# Patient Record
Sex: Female | Born: 1950 | Race: Black or African American | Hispanic: No | Smoking: Former smoker
Health system: Southern US, Community
[De-identification: ages and names within clinical notes are randomized; demographics above are authoritative.]

## PROBLEM LIST (undated history)

## (undated) DIAGNOSIS — K219 Gastro-esophageal reflux disease without esophagitis: Secondary | ICD-10-CM

## (undated) DIAGNOSIS — I219 Acute myocardial infarction, unspecified: Secondary | ICD-10-CM

## (undated) DIAGNOSIS — M1990 Unspecified osteoarthritis, unspecified site: Secondary | ICD-10-CM

## (undated) DIAGNOSIS — F32A Depression, unspecified: Secondary | ICD-10-CM

## (undated) DIAGNOSIS — F329 Major depressive disorder, single episode, unspecified: Secondary | ICD-10-CM

## (undated) HISTORY — PX: ABDOMINAL HYSTERECTOMY: SHX81

---

## 2011-07-27 ENCOUNTER — Emergency Department: Payer: Self-pay | Admitting: Emergency Medicine

## 2017-12-17 ENCOUNTER — Ambulatory Visit: Payer: Self-pay | Admitting: Surgery

## 2017-12-17 NOTE — H&P (View-Only) (Signed)
Subjective:   CC: Sebaceous cyst of left axilla [L72.3] POSTOP  HPI:  Megan Rosales is a 67 y.o. female who is here for followup from above.  Still feels some swelling and has drainage but significantly better.     Current Medications: has a current medication list which includes the following prescription(s): doxycycline and naproxen.  Allergies:       Allergies  Allergen Reactions  . Penicillin Other (See Comments)    Yeast infections    ROS: A 15 point review of systems was performed and pertinent positives and negatives noted in HPI   Objective:   BP (!) 151/90   Pulse 64   Temp 36.6 C (97.8 F) (Oral)   Ht 154.9 cm (5\' 1" )   Wt 77.6 kg (171 lb 1.2 oz)   BMI 32.32 kg/m   Constitutional :  alert, appears stated age, cooperative and no distress  Skin: Cool and moist, I&D site with minimal milky discharge and no erythema, but persistant firm, smooth nodular structure noted deep to are with minimal tenderness    Psychiatric: Normal affect, non-agitated, not confused       LABS:  N/A   RADS: N/A  Assessment:      Sebaceous cyst of left axilla [L72.3]  Plan:   1. Wound still has pin hole opening through which scant creamy discharge noted, but more concerning is persistent nodule like mass deep the incision site despite removal of cyst and contents few days ago.  Residual inflammation of surrounding tissue two days out less likely at this point, possible adjacent cyst that needs removal.  Due to patient anxiety and palpation of it possibly being in the deeper axilla, recommended further exploration, possible excision in the OR.  Discussed surgical excision.  Alternatives include continued observation.  Benefits include possible symptom relief, pathologic evaluation, improved cosmesis. Discussed the risk of surgery including recurrence, chronic pain, post-op infxn, poor cosmesis, poor/delayed wound healing, and possible re-operation to address said  risks. The risks of general anesthetic, if used, includes MI, CVA, sudden death or even reaction to anesthetic medications also discussed.  Typical post-op recovery time of 3-5 days with possible activity restrictions were also discussed.  The patient verbalized understanding and all questions were answered to the patient's satisfaction.  Will schedule.  Consent obtained

## 2017-12-17 NOTE — H&P (Signed)
Subjective:   CC: Sebaceous cyst of left axilla [L72.3] POSTOP  HPI:  Megan Rosales is a 67 y.o. female who is here for followup from above.  Still feels some swelling and has drainage but significantly better.     Current Medications: has a current medication list which includes the following prescription(s): doxycycline and naproxen.  Allergies:       Allergies  Allergen Reactions  . Penicillin Other (See Comments)    Yeast infections    ROS: A 15 point review of systems was performed and pertinent positives and negatives noted in HPI   Objective:   BP (!) 151/90   Pulse 64   Temp 36.6 C (97.8 F) (Oral)   Ht 154.9 cm (5' 1")   Wt 77.6 kg (171 lb 1.2 oz)   BMI 32.32 kg/m   Constitutional :  alert, appears stated age, cooperative and no distress  Skin: Cool and moist, I&D site with minimal milky discharge and no erythema, but persistant firm, smooth nodular structure noted deep to are with minimal tenderness    Psychiatric: Normal affect, non-agitated, not confused       LABS:  N/A   RADS: N/A  Assessment:      Sebaceous cyst of left axilla [L72.3]  Plan:   1. Wound still has pin hole opening through which scant creamy discharge noted, but more concerning is persistent nodule like mass deep the incision site despite removal of cyst and contents few days ago.  Residual inflammation of surrounding tissue two days out less likely at this point, possible adjacent cyst that needs removal.  Due to patient anxiety and palpation of it possibly being in the deeper axilla, recommended further exploration, possible excision in the OR.  Discussed surgical excision.  Alternatives include continued observation.  Benefits include possible symptom relief, pathologic evaluation, improved cosmesis. Discussed the risk of surgery including recurrence, chronic pain, post-op infxn, poor cosmesis, poor/delayed wound healing, and possible re-operation to address said  risks. The risks of general anesthetic, if used, includes MI, CVA, sudden death or even reaction to anesthetic medications also discussed.  Typical post-op recovery time of 3-5 days with possible activity restrictions were also discussed.  The patient verbalized understanding and all questions were answered to the patient's satisfaction.  Will schedule.  Consent obtained    

## 2017-12-19 ENCOUNTER — Ambulatory Visit
Admission: RE | Admit: 2017-12-19 | Discharge: 2017-12-19 | Disposition: A | Discharge status: Home / Self Care | Payer: No Typology Code available for payment source | Attending: Surgery | Admitting: Surgery

## 2017-12-19 ENCOUNTER — Encounter
Admission: RE | Disposition: A | Discharge status: Home / Self Care | Payer: Self-pay | Source: Home / Self Care | Attending: Surgery

## 2017-12-19 ENCOUNTER — Ambulatory Visit: Payer: No Typology Code available for payment source | Admitting: Anesthesiology

## 2017-12-19 ENCOUNTER — Other Ambulatory Visit: Payer: Self-pay

## 2017-12-19 ENCOUNTER — Encounter: Payer: Self-pay | Admitting: *Deleted

## 2017-12-19 DIAGNOSIS — Z791 Long term (current) use of non-steroidal anti-inflammatories (NSAID): Secondary | ICD-10-CM | POA: Insufficient documentation

## 2017-12-19 DIAGNOSIS — Z87891 Personal history of nicotine dependence: Secondary | ICD-10-CM | POA: Diagnosis not present

## 2017-12-19 DIAGNOSIS — L720 Epidermal cyst: Secondary | ICD-10-CM | POA: Insufficient documentation

## 2017-12-19 DIAGNOSIS — Z880 Allergy status to penicillin: Secondary | ICD-10-CM | POA: Insufficient documentation

## 2017-12-19 HISTORY — DX: Major depressive disorder, single episode, unspecified: F32.9

## 2017-12-19 HISTORY — PX: MASS EXCISION: SHX2000

## 2017-12-19 HISTORY — DX: Depression, unspecified: F32.A

## 2017-12-19 LAB — URINE DRUG SCREEN, QUALITATIVE (ARMC ONLY)
Amphetamines, Ur Screen: NOT DETECTED
Barbiturates, Ur Screen: NOT DETECTED
Benzodiazepine, Ur Scrn: NOT DETECTED
Cannabinoid 50 Ng, Ur ~~LOC~~: POSITIVE — AB
Cocaine Metabolite,Ur ~~LOC~~: NOT DETECTED
MDMA (Ecstasy)Ur Screen: NOT DETECTED
Methadone Scn, Ur: NOT DETECTED
Opiate, Ur Screen: NOT DETECTED
Phencyclidine (PCP) Ur S: NOT DETECTED
TRICYCLIC, UR SCREEN: NOT DETECTED

## 2017-12-19 SURGERY — EXCISION MASS
Anesthesia: General | Site: Axilla | Laterality: Left

## 2017-12-19 MED ORDER — FENTANYL CITRATE (PF) 100 MCG/2ML IJ SOLN
INTRAMUSCULAR | Status: AC
  Filled 2017-12-19: qty 2

## 2017-12-19 MED ORDER — GLYCOPYRROLATE 0.2 MG/ML IJ SOLN
INTRAMUSCULAR | Status: DC | PRN
  Administered 2017-12-19: 0.2 mg via INTRAVENOUS

## 2017-12-19 MED ORDER — LACTATED RINGERS IV SOLN
INTRAVENOUS | Status: DC
  Administered 2017-12-19: 08:00:00 via INTRAVENOUS

## 2017-12-19 MED ORDER — ONDANSETRON HCL 4 MG/2ML IJ SOLN: 4.0000 mg | Freq: Once | INTRAMUSCULAR | Status: DC | PRN

## 2017-12-19 MED ORDER — ACETAMINOPHEN 500 MG PO TABS
1000.0000 mg | ORAL_TABLET | ORAL | Status: AC
  Administered 2017-12-19: 1000 mg via ORAL

## 2017-12-19 MED ORDER — PROPOFOL 10 MG/ML IV BOLUS
INTRAVENOUS | Status: AC
  Filled 2017-12-19: qty 60

## 2017-12-19 MED ORDER — DOCUSATE SODIUM 100 MG PO CAPS: 100.0000 mg | ORAL_CAPSULE | Freq: Two times a day (BID) | ORAL | 0 refills | Status: AC | PRN

## 2017-12-19 MED ORDER — ONDANSETRON HCL 4 MG/2ML IJ SOLN
INTRAMUSCULAR | Status: DC | PRN
  Administered 2017-12-19: 4 mg via INTRAVENOUS

## 2017-12-19 MED ORDER — CEFAZOLIN SODIUM-DEXTROSE 2-4 GM/100ML-% IV SOLN
2.0000 g | INTRAVENOUS | Status: AC
  Administered 2017-12-19: 2 g via INTRAVENOUS

## 2017-12-19 MED ORDER — FENTANYL CITRATE (PF) 100 MCG/2ML IJ SOLN
25.0000 ug | INTRAMUSCULAR | Status: DC | PRN
  Administered 2017-12-19: 25 ug via INTRAVENOUS

## 2017-12-19 MED ORDER — EPHEDRINE SULFATE 50 MG/ML IJ SOLN
INTRAMUSCULAR | Status: DC | PRN
  Administered 2017-12-19: 7.5 mg via INTRAVENOUS

## 2017-12-19 MED ORDER — DEXAMETHASONE SODIUM PHOSPHATE 10 MG/ML IJ SOLN
INTRAMUSCULAR | Status: DC | PRN
  Administered 2017-12-19: 5 mg via INTRAVENOUS

## 2017-12-19 MED ORDER — BUPIVACAINE-EPINEPHRINE (PF) 0.5% -1:200000 IJ SOLN
INTRAMUSCULAR | Status: AC
  Filled 2017-12-19: qty 30

## 2017-12-19 MED ORDER — PROPOFOL 10 MG/ML IV BOLUS
INTRAVENOUS | Status: DC | PRN
  Administered 2017-12-19: 150 mg via INTRAVENOUS
  Administered 2017-12-19: 50 mg via INTRAVENOUS

## 2017-12-19 MED ORDER — BUPIVACAINE-EPINEPHRINE 0.5% -1:200000 IJ SOLN
INTRAMUSCULAR | Status: DC | PRN
  Administered 2017-12-19: 10 mL

## 2017-12-19 MED ORDER — CEFAZOLIN SODIUM-DEXTROSE 2-4 GM/100ML-% IV SOLN
INTRAVENOUS | Status: AC
  Filled 2017-12-19: qty 100

## 2017-12-19 MED ORDER — FENTANYL CITRATE (PF) 100 MCG/2ML IJ SOLN
INTRAMUSCULAR | Status: DC | PRN
  Administered 2017-12-19: 50 ug via INTRAVENOUS

## 2017-12-19 MED ORDER — CHLORHEXIDINE GLUCONATE CLOTH 2 % EX PADS: 6.0000 | MEDICATED_PAD | Freq: Once | CUTANEOUS | Status: DC

## 2017-12-19 MED ORDER — FAMOTIDINE 20 MG PO TABS
ORAL_TABLET | ORAL | Status: AC
  Filled 2017-12-19: qty 1

## 2017-12-19 MED ORDER — ACETAMINOPHEN 325 MG PO TABS: 650.0000 mg | ORAL_TABLET | Freq: Three times a day (TID) | ORAL | 0 refills | Status: AC | PRN

## 2017-12-19 MED ORDER — LIDOCAINE HCL 1 % IJ SOLN
INTRAMUSCULAR | Status: DC | PRN
  Administered 2017-12-19: 10 mL

## 2017-12-19 MED ORDER — FAMOTIDINE 20 MG PO TABS
20.0000 mg | ORAL_TABLET | Freq: Once | ORAL | Status: AC
  Administered 2017-12-19: 20 mg via ORAL

## 2017-12-19 MED ORDER — LIDOCAINE HCL (PF) 1 % IJ SOLN
INTRAMUSCULAR | Status: AC
  Filled 2017-12-19: qty 30

## 2017-12-19 MED ORDER — HYDROCODONE-ACETAMINOPHEN 5-325 MG PO TABS: 1.0000 | ORAL_TABLET | Freq: Four times a day (QID) | ORAL | 0 refills | Status: DC | PRN

## 2017-12-19 MED ORDER — LIDOCAINE HCL (CARDIAC) PF 100 MG/5ML IV SOSY
PREFILLED_SYRINGE | INTRAVENOUS | Status: DC | PRN
  Administered 2017-12-19: 100 mg via INTRAVENOUS

## 2017-12-19 MED ORDER — ACETAMINOPHEN 500 MG PO TABS
ORAL_TABLET | ORAL | Status: AC
  Filled 2017-12-19: qty 2

## 2017-12-19 SURGICAL SUPPLY — 30 items
BLADE SURG 15 STRL LF DISP TIS (BLADE) ×1 IMPLANT
BLADE SURG 15 STRL SS (BLADE) ×2
CHLORAPREP W/TINT 26ML (MISCELLANEOUS) ×3 IMPLANT
COVER WAND RF STERILE (DRAPES) ×3 IMPLANT
DERMABOND ADVANCED (GAUZE/BANDAGES/DRESSINGS) ×2
DERMABOND ADVANCED .7 DNX12 (GAUZE/BANDAGES/DRESSINGS) ×1 IMPLANT
DRAPE LAPAROTOMY 100X77 ABD (DRAPES) ×3 IMPLANT
DRAPE SHEET LG 3/4 BI-LAMINATE (DRAPES) ×3 IMPLANT
ELECT CAUTERY BLADE 6.4 (BLADE) ×3 IMPLANT
ELECT REM PT RETURN 9FT ADLT (ELECTROSURGICAL) ×3
ELECTRODE REM PT RTRN 9FT ADLT (ELECTROSURGICAL) ×1 IMPLANT
GLOVE BIOGEL PI IND STRL 7.0 (GLOVE) ×1 IMPLANT
GLOVE BIOGEL PI INDICATOR 7.0 (GLOVE) ×2
GLOVE SURG SYN 6.5 ES PF (GLOVE) ×3 IMPLANT
GOWN STRL REUS W/ TWL LRG LVL3 (GOWN DISPOSABLE) ×2 IMPLANT
GOWN STRL REUS W/TWL LRG LVL3 (GOWN DISPOSABLE) ×4
KIT TURNOVER KIT A (KITS) ×3 IMPLANT
LABEL OR SOLS (LABEL) ×3 IMPLANT
NEEDLE HYPO 22GX1.5 SAFETY (NEEDLE) ×3 IMPLANT
NS IRRIG 1000ML POUR BTL (IV SOLUTION) ×3 IMPLANT
PACK BASIN MINOR ARMC (MISCELLANEOUS) ×3 IMPLANT
SUT ETHILON 3-0 FS-10 30 BLK (SUTURE)
SUT MNCRL 4-0 (SUTURE) ×2
SUT MNCRL 4-0 27XMFL (SUTURE) ×1
SUT VIC AB 3-0 SH 27 (SUTURE) ×2
SUT VIC AB 3-0 SH 27X BRD (SUTURE) ×1 IMPLANT
SUTURE EHLN 3-0 FS-10 30 BLK (SUTURE) IMPLANT
SUTURE MNCRL 4-0 27XMF (SUTURE) ×1 IMPLANT
SYR 30ML LL (SYRINGE) ×3 IMPLANT
TOWEL OR 17X26 4PK STRL BLUE (TOWEL DISPOSABLE) ×3 IMPLANT

## 2017-12-19 NOTE — Anesthesia Preprocedure Evaluation (Signed)
Anesthesia Evaluation  Patient identified by MRN, date of birth, ID band Patient awake    Reviewed: Allergy & Precautions, NPO status , Patient's Chart, lab work & pertinent test results  Airway Mallampati: III       Dental   Pulmonary former smoker,    Pulmonary exam normal        Cardiovascular negative cardio ROS Normal cardiovascular exam     Neuro/Psych PSYCHIATRIC DISORDERS Depression negative neurological ROS     GI/Hepatic negative GI ROS, Neg liver ROS,   Endo/Other  negative endocrine ROS  Renal/GU negative Renal ROS  negative genitourinary   Musculoskeletal negative musculoskeletal ROS (+)   Abdominal Normal abdominal exam  (+)   Peds negative pediatric ROS (+)  Hematology negative hematology ROS (+)   Anesthesia Other Findings   Reproductive/Obstetrics                             Anesthesia Physical Anesthesia Plan  ASA: II  Anesthesia Plan: General   Post-op Pain Management:    Induction: Intravenous  PONV Risk Score and Plan:   Airway Management Planned: LMA and Oral ETT  Additional Equipment:   Intra-op Plan:   Post-operative Plan: Extubation in OR  Informed Consent: I have reviewed the patients History and Physical, chart, labs and discussed the procedure including the risks, benefits and alternatives for the proposed anesthesia with the patient or authorized representative who has indicated his/her understanding and acceptance.   Dental advisory given  Plan Discussed with: CRNA and Surgeon  Anesthesia Plan Comments:         Anesthesia Quick Evaluation

## 2017-12-19 NOTE — Interval H&P Note (Signed)
History and Physical Interval Note:  12/19/2017 7:15 AM  Megan Rosales  has presented today for surgery, with the diagnosis of AXILLARY CYST LEFT  The various methods of treatment have been discussed with the patient and family. After consideration of risks, benefits and other options for treatment, the patient has consented to  Procedure(s): EXCISION LEFT AXILLARY CYST (Left) as a surgical intervention .  The patient's history has been reviewed, patient examined, no change in status, stable for surgery.  I have reviewed the patient's chart and labs.  Questions were answered to the patient's satisfaction.    Pt endorsed marjiuana use this am.  Will send UDS and once clear, will still proceed with surgery   Hakiem Malizia Tonna BoehringerSakai

## 2017-12-19 NOTE — Anesthesia Postprocedure Evaluation (Signed)
Anesthesia Post Note  Patient: Megan Rosales  Procedure(s) Performed: EXCISION LEFT AXILLARY CYST (Left Axilla)  Patient location during evaluation: PACU Anesthesia Type: General Level of consciousness: awake and alert and oriented Pain management: pain level controlled Vital Signs Assessment: post-procedure vital signs reviewed and stable Respiratory status: spontaneous breathing Cardiovascular status: blood pressure returned to baseline Anesthetic complications: no     Last Vitals:  Vitals:   12/19/17 0929 12/19/17 0955  BP: 109/66 127/74  Pulse: 76 70  Resp: 18 18  Temp: 36.5 C 36.6 C  SpO2: 96% 100%    Last Pain:  Vitals:   12/19/17 0955  TempSrc: Oral  PainSc: 2                  Arrin Pintor

## 2017-12-19 NOTE — Anesthesia Procedure Notes (Signed)
Procedure Name: LMA Insertion Date/Time: 12/19/2017 8:00 AM Performed by: Henrietta HooverSmith, Dru Laurel, CRNA Pre-anesthesia Checklist: Patient identified, Emergency Drugs available, Suction available and Patient being monitored Patient Re-evaluated:Patient Re-evaluated prior to induction Preoxygenation: Pre-oxygenation with 100% oxygen Induction Type: IV induction Ventilation: Mask ventilation without difficulty LMA: LMA inserted LMA Size: 4.0 Number of attempts: 1 Placement Confirmation: positive ETCO2 and breath sounds checked- equal and bilateral Tube secured with: Tape Dental Injury: Teeth and Oropharynx as per pre-operative assessment

## 2017-12-19 NOTE — Op Note (Signed)
Pre-Op Dx: left epidermal cyst Post-Op Dx: same Anesthesia: LMA EBL: minimal Complications:  none apparent Specimen: left axillary cyst Procedure: excisional biopsy of left axillary cyst\  Surgeon: Tonna BoehringerSakai   Description of Procedure:  Consent obtained, time out performed.  Patient placed in supine position.  Ancef and SCDs placed.  LMA by anesthesia.  Area sterilized and draped in usual position.  Local infused to area previously marked.  4cm elliptical incision made through dermis with 15blade and the epidermal cyst noted in subcutaneous layer.  The 3cm x 4cm x 3cm cyst then removed from surrounding tissue completely using electrocautery down to subcutaneous tissue, passed off field pending pathology.  Wound irrigated hemostasis noted, then closed in two layer fashion with 3-0 vicryl in interrupted fashion for deep dermal layer, then running 4-0 monocryl in subcuticular fashion for epidermal layer.  Wound then dressed with dermabond.  Pt tolerated procedure well, and transferred to PACU in stable condition. Sponge and instrument count correct at end of procedure.

## 2017-12-19 NOTE — Anesthesia Post-op Follow-up Note (Signed)
Anesthesia QCDR form completed.        

## 2017-12-19 NOTE — Discharge Instructions (Signed)

## 2017-12-19 NOTE — Transfer of Care (Signed)
Immediate Anesthesia Transfer of Care Note  Patient: Megan Rosales  Procedure(s) Performed: EXCISION LEFT AXILLARY CYST (Left Axilla)  Patient Location: PACU  Anesthesia Type:General  Level of Consciousness: awake  Airway & Oxygen Therapy: Patient connected to face mask oxygen  Post-op Assessment: Post -op Vital signs reviewed and stable  Post vital signs: stable  Last Vitals:  Vitals Value Taken Time  BP 128/77 12/19/2017  8:39 AM  Temp    Pulse 110 12/19/2017  8:39 AM  Resp 21 12/19/2017  8:39 AM  SpO2 97 % 12/19/2017  8:39 AM  Vitals shown include unvalidated device data.  Last Pain:  Vitals:   12/19/17 0619  TempSrc: Tympanic  PainSc: 0-No pain         Complications: No apparent anesthesia complications

## 2017-12-20 ENCOUNTER — Encounter: Payer: Self-pay | Admitting: Surgery

## 2017-12-20 LAB — SURGICAL PATHOLOGY

## 2018-06-19 ENCOUNTER — Encounter: Payer: Self-pay | Admitting: Emergency Medicine

## 2018-06-19 ENCOUNTER — Inpatient Hospital Stay
Admission: EM | Admit: 2018-06-19 | Discharge: 2018-06-21 | DRG: 282 | Disposition: A | Discharge status: Home / Self Care | Payer: PRIVATE HEALTH INSURANCE | Attending: Internal Medicine | Admitting: Internal Medicine

## 2018-06-19 ENCOUNTER — Other Ambulatory Visit: Payer: Self-pay

## 2018-06-19 ENCOUNTER — Emergency Department: Payer: PRIVATE HEALTH INSURANCE

## 2018-06-19 DIAGNOSIS — I214 Non-ST elevation (NSTEMI) myocardial infarction: Secondary | ICD-10-CM | POA: Diagnosis present

## 2018-06-19 DIAGNOSIS — Z1159 Encounter for screening for other viral diseases: Secondary | ICD-10-CM | POA: Diagnosis not present

## 2018-06-19 DIAGNOSIS — I10 Essential (primary) hypertension: Secondary | ICD-10-CM | POA: Diagnosis present

## 2018-06-19 DIAGNOSIS — F329 Major depressive disorder, single episode, unspecified: Secondary | ICD-10-CM | POA: Diagnosis present

## 2018-06-19 DIAGNOSIS — Z87891 Personal history of nicotine dependence: Secondary | ICD-10-CM | POA: Diagnosis not present

## 2018-06-19 DIAGNOSIS — R079 Chest pain, unspecified: Secondary | ICD-10-CM

## 2018-06-19 DIAGNOSIS — Z880 Allergy status to penicillin: Secondary | ICD-10-CM

## 2018-06-19 LAB — CBC WITH DIFFERENTIAL/PLATELET
Abs Immature Granulocytes: 0.03 10*3/uL (ref 0.00–0.07)
Basophils Absolute: 0 10*3/uL (ref 0.0–0.1)
Basophils Relative: 0 %
Eosinophils Absolute: 0.2 10*3/uL (ref 0.0–0.5)
Eosinophils Relative: 2 %
HCT: 39.2 % (ref 36.0–46.0)
Hemoglobin: 13.1 g/dL (ref 12.0–15.0)
Immature Granulocytes: 0 %
Lymphocytes Relative: 26 %
Lymphs Abs: 2.7 10*3/uL (ref 0.7–4.0)
MCH: 30 pg (ref 26.0–34.0)
MCHC: 33.4 g/dL (ref 30.0–36.0)
MCV: 89.7 fL (ref 80.0–100.0)
Monocytes Absolute: 0.8 10*3/uL (ref 0.1–1.0)
Monocytes Relative: 7 %
Neutro Abs: 6.7 10*3/uL (ref 1.7–7.7)
Neutrophils Relative %: 65 %
Platelets: 218 10*3/uL (ref 150–400)
RBC: 4.37 MIL/uL (ref 3.87–5.11)
RDW: 12.6 % (ref 11.5–15.5)
WBC: 10.5 10*3/uL (ref 4.0–10.5)
nRBC: 0 % (ref 0.0–0.2)

## 2018-06-19 LAB — PROTIME-INR
INR: 1.1 (ref 0.8–1.2)
Prothrombin Time: 14 seconds (ref 11.4–15.2)

## 2018-06-19 LAB — COMPREHENSIVE METABOLIC PANEL
ALT: 33 U/L (ref 0–44)
AST: 40 U/L (ref 15–41)
Albumin: 3.8 g/dL (ref 3.5–5.0)
Alkaline Phosphatase: 118 U/L (ref 38–126)
Anion gap: 9 (ref 5–15)
BUN: 13 mg/dL (ref 8–23)
CO2: 23 mmol/L (ref 22–32)
Calcium: 9.2 mg/dL (ref 8.9–10.3)
Chloride: 109 mmol/L (ref 98–111)
Creatinine, Ser: 0.76 mg/dL (ref 0.44–1.00)
GFR calc Af Amer: >60 mL/min (ref >60)
GFR calc non Af Amer: >60 mL/min (ref >60)
Glucose, Bld: 106 mg/dL — ABNORMAL HIGH (ref 70–99)
Potassium: 3.6 mmol/L (ref 3.5–5.1)
Sodium: 141 mmol/L (ref 135–145)
Total Bilirubin: 0.5 mg/dL (ref 0.3–1.2)
Total Protein: 7.3 g/dL (ref 6.5–8.1)

## 2018-06-19 LAB — APTT: aPTT: 30 seconds (ref 24–36)

## 2018-06-19 LAB — TROPONIN I: Troponin I: 0.85 ng/mL (ref <0.03)

## 2018-06-19 MED ORDER — ACETAMINOPHEN 650 MG RE SUPP: 650.0000 mg | Freq: Four times a day (QID) | RECTAL | Status: DC | PRN

## 2018-06-19 MED ORDER — MAGNESIUM HYDROXIDE 400 MG/5ML PO SUSP: 30.0000 mL | Freq: Every day | ORAL | Status: DC | PRN

## 2018-06-19 MED ORDER — ACETAMINOPHEN 325 MG PO TABS: 650.0000 mg | ORAL_TABLET | Freq: Four times a day (QID) | ORAL | Status: DC | PRN

## 2018-06-19 MED ORDER — TICAGRELOR 90 MG PO TABS
90.0000 mg | ORAL_TABLET | Freq: Two times a day (BID) | ORAL | Status: DC
  Administered 2018-06-20 (×2): 90 mg via ORAL
  Filled 2018-06-19 (×2): qty 1

## 2018-06-19 MED ORDER — NITROGLYCERIN 0.4 MG SL SUBL: 0.4000 mg | SUBLINGUAL_TABLET | SUBLINGUAL | Status: DC | PRN

## 2018-06-19 MED ORDER — TRAZODONE HCL 50 MG PO TABS
25.0000 mg | ORAL_TABLET | Freq: Every evening | ORAL | Status: DC | PRN
  Administered 2018-06-20: 25 mg via ORAL
  Filled 2018-06-19: qty 1

## 2018-06-19 MED ORDER — SODIUM CHLORIDE 0.9 % IV SOLN
INTRAVENOUS | Status: DC
  Administered 2018-06-20: 07:00:00 via INTRAVENOUS

## 2018-06-19 MED ORDER — ASPIRIN 81 MG PO CHEW
324.0000 mg | CHEWABLE_TABLET | Freq: Once | ORAL | Status: AC
  Administered 2018-06-19: 324 mg via ORAL
  Filled 2018-06-19: qty 4

## 2018-06-19 MED ORDER — NITROGLYCERIN 0.4 MG SL SUBL
0.4000 mg | SUBLINGUAL_TABLET | SUBLINGUAL | Status: DC | PRN
  Administered 2018-06-19: 0.4 mg via SUBLINGUAL
  Filled 2018-06-19: qty 1

## 2018-06-19 MED ORDER — METOPROLOL TARTRATE 25 MG PO TABS
25.0000 mg | ORAL_TABLET | Freq: Two times a day (BID) | ORAL | Status: DC
  Administered 2018-06-20 (×2): 25 mg via ORAL
  Filled 2018-06-19 (×3): qty 1

## 2018-06-19 MED ORDER — IOPAMIDOL (ISOVUE-370) INJECTION 76%
100.0000 mL | Freq: Once | INTRAVENOUS | Status: AC | PRN
  Administered 2018-06-19: 100 mL via INTRAVENOUS

## 2018-06-19 MED ORDER — ONDANSETRON HCL 4 MG PO TABS: 4.0000 mg | ORAL_TABLET | Freq: Four times a day (QID) | ORAL | Status: DC | PRN

## 2018-06-19 MED ORDER — TICAGRELOR 90 MG PO TABS
180.0000 mg | ORAL_TABLET | ORAL | Status: AC
  Administered 2018-06-19: 180 mg via ORAL
  Filled 2018-06-19: qty 2

## 2018-06-19 MED ORDER — ATORVASTATIN CALCIUM 20 MG PO TABS
40.0000 mg | ORAL_TABLET | Freq: Every day | ORAL | Status: DC
  Filled 2018-06-19: qty 2

## 2018-06-19 MED ORDER — ENOXAPARIN SODIUM 80 MG/0.8ML ~~LOC~~ SOLN
1.0000 mg/kg | Freq: Two times a day (BID) | SUBCUTANEOUS | Status: DC
  Administered 2018-06-19 – 2018-06-20 (×3): 75 mg via SUBCUTANEOUS
  Filled 2018-06-19 (×3): qty 0.8

## 2018-06-19 MED ORDER — ONDANSETRON HCL 4 MG/2ML IJ SOLN: 4.0000 mg | Freq: Four times a day (QID) | INTRAMUSCULAR | Status: DC | PRN

## 2018-06-19 MED ORDER — ASPIRIN EC 325 MG PO TBEC: 325.0000 mg | DELAYED_RELEASE_TABLET | Freq: Every day | ORAL | Status: DC

## 2018-06-19 MED ORDER — ENOXAPARIN SODIUM 80 MG/0.8ML ~~LOC~~ SOLN: 1.0000 mg/kg | Freq: Two times a day (BID) | SUBCUTANEOUS | Status: DC

## 2018-06-19 NOTE — H&P (Addendum)
[facility] [place] [facility]   PATIENT NAME: Megan Rosales    MR#:  [id number]  DATE OF BIRTH:  1950/07/05  DATE OF ADMISSION:  06/19/2018  PRIMARY CARE PHYSICIAN: Patient, No Pcp Per   REQUESTING/REFERRING PHYSICIAN: Sharyn CreamerQuale, Mark, MD  CHIEF COMPLAINT:   Chief Complaint  Patient presents with  . Chest Pain    HISTORY OF PRESENT ILLNESS:  Megan JohnRose Megan Rosales  is a 68 y.o. African-American female with a known history of depression, who presented to the emergency room with an onset of midsternal chest pain felt as somebody laying on her chest, severe in intensity that woke her up this morning.  She went to work however and it has been slightly increasing at work.  She continued and after going home it became much worse before she came to the ER.  She stated that she ate pork chops a couple of nights ago and since then has been having nausea and vomiting.  She denied any diaphoresis with her chest pain, dyspnea or palpitations.  No recent cough or wheezing or fever or chills.  No recent sick exposures.  No dysuria, oliguria or hematuria or flank pain.  No other bleeding diathesis.  Upon presentation to the emergency room, blood pressure was 152/91 with otherwise normal vital signs.  Labs were remarkable for a troponin of 0.85 and her CMP and CBC were unremarkable.  EKG showed normal sinus rhythm with a rate of 70 with T wave inversion in V1 and flattening of T waves in V2.  Repeat EKG about an hour later showed sinus rhythm with a rate of 87 with no changes.  Chest CTA showed no PE.  Lungs were clear.  There was an enlarged multinodular thyroid with recommendation for outpatient ultrasound.  Dr. Welton FlakesKhan was notified about the patient.  She was given 4 baby aspirin and 75 mg subtenons Lovenox as well as 180 mg of p.o. Brilinta and sublingual nitroglycerin.  She was pain-free during my interview.  She will be admitted to a telemetry bed for further evaluation and management. PAST MEDICAL  HISTORY:   Past Medical History:  Diagnosis Date  . Depression     PAST SURGICAL HISTORY:   Past Surgical History:  Procedure Laterality Date  . ABDOMINAL HYSTERECTOMY    . MASS EXCISION Left 12/19/2017   Procedure: EXCISION LEFT AXILLARY CYST;  Surgeon: Sung AmabileSakai, Isami, DO;  Location: [facility] ORS;  Service: General;  Laterality: Left;    SOCIAL HISTORY:   Social History   Tobacco Use  . Smoking status: Former Games developermoker  . Smokeless tobacco: Never Used  Substance Use Topics  . Alcohol use: Not on file    Comment: once a month    FAMILY HISTORY:  No family history on file.  DRUG ALLERGIES:   Allergies  Allergen Reactions  . Penicillins     Makes genitals burn    REVIEW OF SYSTEMS:   ROS As per history of present illness. All pertinent systems were reviewed above. Constitutional,  HEENT, cardiovascular, respiratory, GI, GU, musculoskeletal, neuro, psychiatric, endocrine,  integumentary and hematologic systems were reviewed and are otherwise  negative/unremarkable except for positive findings mentioned above in the HPI.   MEDICATIONS AT HOME:   Prior to Admission medications   Not on File      VITAL SIGNS:  Blood pressure (!) 139/95, pulse 77, temperature 98 F (36.7 C), temperature source Oral, resp. rate 15, height 5\' 1"  (1.549 m), weight 77.1 kg, SpO2 100 %.  PHYSICAL EXAMINATION:  Physical Exam  GENERAL:  68 y.o.-year-old African-American female patient lying in the bed with no acute distress.  EYES: Pupils equal, round, reactive to light and accommodation. No scleral icterus. Extraocular muscles intact.  HEENT: Head atraumatic, normocephalic. Oropharynx and nasopharynx clear.  NECK:  Supple, no jugular venous distention. No thyroid enlargement, no tenderness.  LUNGS: Normal breath sounds bilaterally, no wheezing, rales,rhonchi or crepitation. No use of accessory muscles of respiration.  CARDIOVASCULAR: Regular rate and rhythm, S1, S2 normal. No murmurs,  rubs, or gallops.  ABDOMEN: Soft, nondistended, nontender. Bowel sounds present. No organomegaly or mass.  EXTREMITIES: No pedal edema, cyanosis, or clubbing.  NEUROLOGIC: Cranial nerves II through XII are intact. Muscle strength 5/5 in all extremities. Sensation intact. Gait not checked.  PSYCHIATRIC: The patient is alert and oriented x 3.  Normal affect and good eye contact. SKIN: No obvious rash, lesion, or ulcer.   LABORATORY PANEL:   CBC Recent Labs  Lab 06/19/18 2104  WBC 10.5  HGB 13.1  HCT 39.2  PLT 218   ------------------------------------------------------------------------------------------------------------------  Chemistries  Recent Labs  Lab 06/19/18 2104  NA 141  K 3.6  CL 109  CO2 23  GLUCOSE 106*  BUN 13  CREATININE 0.76  CALCIUM 9.2  AST 40  ALT 33  ALKPHOS 118  BILITOT 0.5   ------------------------------------------------------------------------------------------------------------------  Cardiac Enzymes Recent Labs  Lab 06/19/18 2104  TROPONINI 0.85*   ------------------------------------------------------------------------------------------------------------------  RADIOLOGY:  Ct Angio Chest Aorta W And/or Wo Contrast  Result Date: 06/19/2018 CLINICAL DATA:  Concern for aortic dissection.  Chest pain. EXAM: CT ANGIOGRAPHY CHEST WITH CONTRAST TECHNIQUE: Multidetector CT imaging of the chest was performed using the standard protocol during bolus administration of intravenous contrast. Multiplanar CT image reconstructions and MIPs were obtained to evaluate the vascular anatomy. CONTRAST:  133mL ISOVUE-370 IOPAMIDOL (ISOVUE-370) INJECTION 76% COMPARISON:  None. FINDINGS: Cardiovascular: There mild aortic calcifications. There is no PE. The heart size is normal. The main pulmonary artery is not significantly dilated. There is no evidence of a thoracic aortic aneurysm. The thoracic aorta is somewhat tortuous. There is no significant pericardial  effusion. Mediastinum/Nodes: There are no pathologically enlarged mediastinal or hilar lymph nodes. No pathologically enlarged axillary lymph nodes. The thyroid gland is diffusely heterogeneous with multiple thyroid nodules. Lungs/Pleura: Lungs are clear. No pleural effusion or pneumothorax. Upper Abdomen: There is a lipid rich benign adrenal adenoma involving the left adrenal gland measuring approximately 1.8 cm. The right adrenal gland is unremarkable. The remaining portions of the upper abdomen are unremarkable. Musculoskeletal: No chest wall abnormality. No acute or significant osseous findings. Review of the MIP images confirms the above findings. IMPRESSION: 1. No dissection.  No PE. 2. The lungs are clear. 3. Enlarged multinodular thyroid gland. Follow-up with nonemergent outpatient thyroid ultrasound is recommended for further evaluation. Aortic Atherosclerosis (ICD10-I70.0). Electronically Signed   By: Constance Holster M.D.   On: 06/19/2018 22:48      IMPRESSION AND PLAN:   1.  Non-STEMI.  The patient will be admitted to a telemetry bed.  Will follow serial troponin levels.  We will continue the patient on therapeutic Lovenox, p.o. Brilinta and aspirin as well as add statin therapy with Lipitor and beta-blocker therapy with Lopressor.  She will be placed on Nitropaste for any recurrence of her chest pain.  Will obtain a 2D echo in a.m.  Cardiology consult will be obtained in a.m.  Dr. Humphrey Rolls was notified about the patient.  She will be placed on  PRN IV morphine sulfate and sublingual nitroglycerin for pain.  2.  Elevated blood pressure.  Her blood pressure will be monitored.  She will be started on p.o. Lopressor as mentioned above.  3.  Depression.  This is apparently fairly controlled.  4.  DVT prophylaxis.  This will be provided with therapeutic Lovenox.    All the records are reviewed and case discussed with ED provider. The plan of care was discussed in details with the patient (and  family). I answered all questions. The patient agreed to proceed with the above mentioned plan. Further management will depend upon hospital course.   CODE STATUS: Full code  TOTAL TIME TAKING CARE OF THIS PATIENT: 50 minutes.    Hannah BeatJan A Clyde Zarrella M.D on 06/19/2018 at 11:59 PM  Pager - [phone]  After 6pm go to [email or web address] - Social research officer, governmentpassword EPAS [facility]  [facility] [place] [facility]  Office  [phone]  CC: Primary care physician; Patient, No Pcp Per   Note: This dictation was prepared with Dragon dictation along with smaller phrase technology. Any transcriptional errors that result from this process are unintentional.

## 2018-06-19 NOTE — Consult Note (Addendum)
ANTICOAGULATION CONSULT NOTE - Initial Consult  Pharmacy Consult for Enoxaparin  Indication: chest pain/ACS  Allergies  Allergen Reactions  . Penicillins     Makes genitals burn   Patient Measurements: Height: 5\' 1"  (154.9 cm) Weight: 170 lb (77.1 kg) IBW/kg (Calculated) : 47.8  Vital Signs: Temp: 98 F (36.7 C) (06/09 2101) Temp Source: Oral (06/09 2101) BP: 139/95 (06/09 2202) Pulse Rate: 77 (06/09 2101)  Labs: Recent Labs    06/19/18 2104 06/19/18 2205  HGB 13.1  --   HCT 39.2  --   PLT 218  --   APTT  --  30  LABPROT  --  14.0  INR  --  1.1  CREATININE 0.76  --   TROPONINI 0.85*  --     Estimated Creatinine Clearance: 64.1 mL/min (by C-G formula based on SCr of 0.76 mg/dL).   Medical History: Past Medical History:  Diagnosis Date  . Depression     Assessment: Pharmacy consulted for enoxaparin dosing for ACS/STEMI  For 68 yo female admitted with chest pain on 6/9. No reported anticoagulants PTA.  Baseline labs ordered.   Goal of Therapy:  Monitor platelets by anticoagulation protocol: Yes   Plan:  Start enoxaparin 1mg /kg (~75mg ) every 12 hours. Monitor Hgb, Hct, and Plts.   Pernell Dupre, PharmD, BCPS Clinical Pharmacist 06/19/2018 11:12 PM

## 2018-06-19 NOTE — ED Provider Notes (Signed)
[facility] Emergency Department Provider Note ____________________________________________   First MD Initiated Contact with Patient 06/19/18 2136     (approximate)  I have reviewed the triage vital signs and the nursing notes.  HISTORY  Chief Complaint Chest Pain   HPI Megan Rosales is a 68 y.o. female for evaluation of chest pain.  Having chest pain comes and goes feels like a pressure on the left side  A 68 year old patient with a history of obesity presents for evaluation of chest pain. Initial onset of pain was more than 6 hours ago. The patient's chest pain is described as heaviness/pressure/tightness and is not worse with exertion. The patient complains of nausea. The patient's chest pain is middle- or left-sided, is not well-localized, is not sharp and does radiate to the arms/jaw/neck. The patient denies diaphoresis. The patient has smoked in the past 90 days. The patient has no history of stroke, has no history of peripheral artery disease, denies any history of treated diabetes, has no relevant family history of coronary artery disease (first degree relative at less than age 6), is not hypertensive and has no history of hypercholesterolemia.      Past Medical History:  Diagnosis Date  . Depression     There are no active problems to display for this patient.   Past Surgical History:  Procedure Laterality Date  . ABDOMINAL HYSTERECTOMY    . MASS EXCISION Left 12/19/2017   Procedure: EXCISION LEFT AXILLARY CYST;  Surgeon: Benjamine Sprague, DO;  Location: [facility] ORS;  Service: General;  Laterality: Left;    Prior to Admission medications   Not on File    Allergies Penicillins  No family history on file.  Social History Social History   Tobacco Use  . Smoking status: Former Research scientist (life sciences)  . Smokeless tobacco: Never Used  Substance Use Topics  . Alcohol use: Not on file    Comment: once a month  . Drug use: Yes    Types: Marijuana   Comment: Dec 07, 2017    Review of Systems Constitutional: No fever/chills Eyes: No visual changes. ENT: No sore throat. Cardiovascular: See HPI Respiratory: Denies shortness of breath. Gastrointestinal: No abdominal pain.   Genitourinary: Negative for dysuria. Musculoskeletal: Negative for back pain. Skin: Negative for rash. Neurological: Negative for headaches, areas of focal weakness or numbness.    ____________________________________________   PHYSICAL EXAM:  VITAL SIGNS: ED Triage Vitals  Enc Vitals Group     BP 06/19/18 2101 (!) 152/91     Pulse Rate 06/19/18 2101 77     Resp 06/19/18 2101 18     Temp 06/19/18 2101 98 F (36.7 C)     Temp Source 06/19/18 2101 Oral     SpO2 06/19/18 2101 100 %     Weight 06/19/18 2100 170 lb (77.1 kg)     Height 06/19/18 2100 5\' 1"  (1.549 m)     Head Circumference --      Peak Flow --      Pain Score 06/19/18 2100 9     Pain Loc --      Pain Edu? --      Excl. in [place]? --     Constitutional: Alert and oriented.  Appears in modest pain holding her left hand over her chest.  She appears just slightly clammy or diaphoretic Eyes: Conjunctivae are normal. Head: Atraumatic. Nose: No congestion/rhinnorhea. Mouth/Throat: Mucous membranes are moist. Neck: No stridor.  Cardiovascular: Normal rate, regular rhythm. Grossly normal heart sounds.  Good peripheral circulation. Respiratory: Normal respiratory effort.  No retractions. Lungs CTAB. Gastrointestinal: Soft and nontender. No distention. Musculoskeletal: No lower extremity tenderness nor edema. Neurologic:  Normal speech and language. No gross focal neurologic deficits are appreciated.  Skin:  Skin is warm, dry and intact. No rash noted. Psychiatric: Mood and affect are normal. Speech and behavior are normal.  ____________________________________________   LABS (all labs ordered are listed, but only abnormal results are displayed)  Labs Reviewed  COMPREHENSIVE METABOLIC  PANEL - Abnormal; Notable for the following components:      Result Value   Glucose, Bld 106 (*)    All other components within normal limits  TROPONIN I - Abnormal; Notable for the following components:   Troponin I 0.85 (*)    All other components within normal limits  NOVEL CORONAVIRUS, NAA (HOSPITAL ORDER, SEND-OUT TO REF LAB)  CBC WITH DIFFERENTIAL/PLATELET  PROTIME-INR  APTT   ____________________________________________  EKG  ED ECG REPORT I, Sharyn CreamerMark Quale, the attending physician, personally viewed and interpreted this ECG.  Date: 06/19/2018 EKG Time: 2105 Rate: 80 Rhythm: normal sinus rhythm QRS Axis: normal Intervals: normal ST/T Wave abnormalities: normal Narrative Interpretation: no evidence of acute ischemia  ____________________________________________  RADIOLOGY  Ct Angio Chest Aorta W And/or Wo Contrast  Result Date: 06/19/2018 CLINICAL DATA:  Concern for aortic dissection.  Chest pain. EXAM: CT ANGIOGRAPHY CHEST WITH CONTRAST TECHNIQUE: Multidetector CT imaging of the chest was performed using the standard protocol during bolus administration of intravenous contrast. Multiplanar CT image reconstructions and MIPs were obtained to evaluate the vascular anatomy. CONTRAST:  100mL ISOVUE-370 IOPAMIDOL (ISOVUE-370) INJECTION 76% COMPARISON:  None. FINDINGS: Cardiovascular: There mild aortic calcifications. There is no PE. The heart size is normal. The main pulmonary artery is not significantly dilated. There is no evidence of a thoracic aortic aneurysm. The thoracic aorta is somewhat tortuous. There is no significant pericardial effusion. Mediastinum/Nodes: There are no pathologically enlarged mediastinal or hilar lymph nodes. No pathologically enlarged axillary lymph nodes. The thyroid gland is diffusely heterogeneous with multiple thyroid nodules. Lungs/Pleura: Lungs are clear. No pleural effusion or pneumothorax. Upper Abdomen: There is a lipid rich benign adrenal adenoma  involving the left adrenal gland measuring approximately 1.8 cm. The right adrenal gland is unremarkable. The remaining portions of the upper abdomen are unremarkable. Musculoskeletal: No chest wall abnormality. No acute or significant osseous findings. Review of the MIP images confirms the above findings. IMPRESSION: 1. No dissection.  No PE. 2. The lungs are clear. 3. Enlarged multinodular thyroid gland. Follow-up with nonemergent outpatient thyroid ultrasound is recommended for further evaluation. Aortic Atherosclerosis (ICD10-I70.0). Electronically Signed   By: Katherine Mantlehristopher  Green M.D.   On: 06/19/2018 22:48     CT angiogram reviewed negative for acute though enlarged goiter is noted ____________________________________________   PROCEDURES  Procedure(s) performed: None  Procedures  Critical Care performed: Yes, see critical care note(s)  CRITICAL CARE Performed by: Sharyn CreamerMark Quale   Total critical care time: 30 minutes  Critical care time was exclusive of separately billable procedures and treating other patients.  Critical care was necessary to treat or prevent imminent or life-threatening deterioration.  Critical care was time spent personally by me on the following activities: development of treatment plan with patient and/or surrogate as well as nursing, discussions with consultants, evaluation of patient's response to treatment, examination of patient, obtaining history from patient or surrogate, ordering and performing treatments and interventions, ordering and review of laboratory studies, ordering and review of radiographic studies, pulse  oximetry and re-evaluation of patient's condition.  ____________________________________________   INITIAL IMPRESSION / ASSESSMENT AND PLAN / ED COURSE  Pertinent labs & imaging results that were available during my care of the patient were reviewed by me and considered in my medical decision making (see chart for details).   Differential  diagnosis includes, but is not limited to, ACS, aortic dissection, pulmonary embolism, cardiac tamponade, pneumothorax, pneumonia, pericarditis, myocarditis, GI-related causes including esophagitis/gastritis, and musculoskeletal chest wall pain.    Initial EKG shows no ischemic change, however her history is concerning and her troponin is elevated 0.85.  Based on her history normal EKG and symptoms left-sided chest pain rating to the arm neck and the left upper back and her hypertension I have ordered a CT angiogram to exclude dissection.  Salicylates and nitrates for now, careful reassessment patient is now on telemetry.   Clinical Course as of Jun 18 2332  Tue Jun 19, 2018  2158 Repeat EKG is reviewed entered by me at 2200Heart rate 90QRS 80QTc 440Normal sinus rhythm, no evidence of ischemia or ectopy.   [MQ]    Clinical Course User Index [MQ] Sharyn CreamerQuale, Mark, MD   Megan Rosales was evaluated in Emergency Department on 06/19/2018 for the symptoms described in the history of present illness. She was evaluated in the context of the global COVID-19 pandemic, which necessitated consideration that the patient might be at risk for infection with the SARS-CoV-2 virus that causes COVID-19. Institutional protocols and algorithms that pertain to the evaluation of patients at risk for COVID-19 are in a state of rapid change based on information released by regulatory bodies including the CDC and federal and state organizations. These policies and algorithms were followed during the patient's care in the ED.   ----------------------------------------- 11:33 PM on 06/19/2018 -----------------------------------------  Patient is pain-free at this point after aspirin nitrates.  Discussed with Dr. Lennette BihariKohn of cardiology, he will see patient in consult and recommends loading with Brilinta and Lovenox at this point.  Discussed with hospitalist service nurse practitioner [facility] will admit  ____________________________________________   FINAL CLINICAL IMPRESSION(S) / ED DIAGNOSES  Final diagnoses:  NSTEMI (non-ST elevated myocardial infarction) [facility](HCC)        Note:  This document was prepared using Dragon voice recognition software and may include unintentional dictation errors       Sharyn CreamerQuale, Mark, MD 06/19/18 2334

## 2018-06-19 NOTE — ED Notes (Signed)
Patient transported to CT 

## 2018-06-19 NOTE — ED Triage Notes (Signed)
Pt to triage via w/c, mask in place with no distress noted; pt reports several days having left sided CP radiating into back and neck with no accomp symptoms; denies hx of same

## 2018-06-20 ENCOUNTER — Inpatient Hospital Stay
Admit: 2018-06-20 | Discharge: 2018-06-20 | Disposition: A | Discharge status: Home / Self Care | Payer: PRIVATE HEALTH INSURANCE | Attending: Family Medicine | Admitting: Family Medicine

## 2018-06-20 ENCOUNTER — Other Ambulatory Visit: Payer: Self-pay | Admitting: Cardiovascular Disease

## 2018-06-20 LAB — BASIC METABOLIC PANEL
Anion gap: 7 (ref 5–15)
BUN: 9 mg/dL (ref 8–23)
CO2: 25 mmol/L (ref 22–32)
Calcium: 9.3 mg/dL (ref 8.9–10.3)
Chloride: 109 mmol/L (ref 98–111)
Creatinine, Ser: 0.78 mg/dL (ref 0.44–1.00)
GFR calc Af Amer: >60 mL/min (ref >60)
GFR calc non Af Amer: >60 mL/min (ref >60)
Glucose, Bld: 98 mg/dL (ref 70–99)
Potassium: 4.2 mmol/L (ref 3.5–5.1)
Sodium: 141 mmol/L (ref 135–145)

## 2018-06-20 LAB — CBC
HCT: 40.3 % (ref 36.0–46.0)
Hemoglobin: 13.3 g/dL (ref 12.0–15.0)
MCH: 29.7 pg (ref 26.0–34.0)
MCHC: 33 g/dL (ref 30.0–36.0)
MCV: 90 fL (ref 80.0–100.0)
Platelets: 216 10*3/uL (ref 150–400)
RBC: 4.48 MIL/uL (ref 3.87–5.11)
RDW: 12.6 % (ref 11.5–15.5)
WBC: 9.7 10*3/uL (ref 4.0–10.5)
nRBC: 0 % (ref 0.0–0.2)

## 2018-06-20 LAB — TROPONIN I
Troponin I: 4.96 ng/mL (ref <0.03)
Troponin I: 7.03 ng/mL (ref <0.03)
Troponin I: 6.27 ng/mL (ref <0.03)
Troponin I: 5.54 ng/mL (ref <0.03)

## 2018-06-20 MED ORDER — ASPIRIN EC 81 MG PO TBEC
81.0000 mg | DELAYED_RELEASE_TABLET | Freq: Every day | ORAL | Status: DC
  Administered 2018-06-20 – 2018-06-21 (×2): 81 mg via ORAL
  Filled 2018-06-20 (×2): qty 1

## 2018-06-20 MED ORDER — SODIUM CHLORIDE 0.9% FLUSH
3.0000 mL | Freq: Two times a day (BID) | INTRAVENOUS | Status: AC
  Filled 2018-06-20: qty 3

## 2018-06-20 MED ORDER — PNEUMOCOCCAL VAC POLYVALENT 25 MCG/0.5ML IJ INJ: 0.5000 mL | INJECTION | INTRAMUSCULAR | Status: DC

## 2018-06-20 MED ORDER — MORPHINE SULFATE 10 MG/5ML PO SOLN: 2.0000 mg | ORAL | Status: DC | PRN

## 2018-06-20 MED ORDER — ATORVASTATIN CALCIUM 20 MG PO TABS
40.0000 mg | ORAL_TABLET | Freq: Every day | ORAL | Status: DC
  Administered 2018-06-20: 40 mg via ORAL
  Filled 2018-06-20: qty 2

## 2018-06-20 NOTE — ED Notes (Signed)
Pt given crackers at this time.

## 2018-06-20 NOTE — Progress Notes (Signed)
*  PRELIMINARY RESULTS* Echocardiogram 2D Echocardiogram has been performed.  Megan Rosales 06/20/2018, 10:52 AM

## 2018-06-20 NOTE — ED Notes (Signed)
Patient given lunch tray.  Will continue to monitor.

## 2018-06-20 NOTE — Consult Note (Signed)
Megan Rosales is a 68 y.o. female  [id number]  Primary Cardiologist: New patient to Dr. Neoma Laming Reason for Consultation: NSTEMI  HPI: 68yo African-American female with a past medial history of depression presented to ER with mid-sternal chest pain and pressure as if someone was sitting on her chest. Troponin was founded to be elevated to 7.03 on third troponin level. EKG showed flattened T waves in V2 but otherwise no acute changes. CTA chest showed no PE or aortic dissection. She was started on Brilinta and was admitted for further management.    Review of Systems: No chest pain or shortness of breath. Feeling tired but otherwise well.    Past Medical History:  Diagnosis Date  . Depression     (Not in a hospital admission)    . aspirin EC  81 mg Oral Daily  . atorvastatin  40 mg Oral q1800  . enoxaparin (LOVENOX) injection  1 mg/kg Subcutaneous Q12H  . metoprolol tartrate  25 mg Oral BID  . ticagrelor  90 mg Oral BID    Infusions: . sodium chloride 100 mL/hr at 06/20/18 0938    Allergies  Allergen Reactions  . Penicillins     Makes genitals burn    Social History   Socioeconomic History  . Marital status: Divorced    Spouse name: Not on file  . Number of children: Not on file  . Years of education: Not on file  . Highest education level: Not on file  Occupational History  . Not on file  Social Needs  . Financial resource strain: Not on file  . Food insecurity:    Worry: Not on file    Inability: Not on file  . Transportation needs:    Medical: Not on file    Non-medical: Not on file  Tobacco Use  . Smoking status: Former Research scientist (life sciences)  . Smokeless tobacco: Never Used  Substance and Sexual Activity  . Alcohol use: Not on file    Comment: once a month  . Drug use: Yes    Types: Marijuana    Comment: Dec 07, 2017  . Sexual activity: Not on file  Lifestyle  . Physical activity:    Days per week: Not on file    Minutes per session: Not on file  . Stress:  Not on file  Relationships  . Social connections:    Talks on phone: Not on file    Gets together: Not on file    Attends religious service: Not on file    Active member of club or organization: Not on file    Attends meetings of clubs or organizations: Not on file    Relationship status: Not on file  . Intimate partner violence:    Fear of current or ex partner: Not on file    Emotionally abused: Not on file    Physically abused: Not on file    Forced sexual activity: Not on file  Other Topics Concern  . Not on file  Social History Narrative  . Not on file    No family history on file.  PHYSICAL EXAM: Vitals:   06/20/18 0800 06/20/18 0830  BP: (!) 168/78 (!) 163/92  Pulse: 64 69  Resp: 16 20  Temp:    SpO2: 100% 100%    No intake or output data in the 24 hours ending 06/20/18 0924  General:  Well appearing. No respiratory difficulty HEENT: normal Neck: supple. no JVD. Carotids 2+ bilat; no bruits. No lymphadenopathy or  thryomegaly appreciated. Cor: PMI nondisplaced. Regular rate & rhythm. No rubs, gallops or murmurs. Lungs: clear Abdomen: soft, nontender, nondistended. No hepatosplenomegaly. No bruits or masses. Good bowel sounds. Extremities: no cyanosis, clubbing, rash, edema Neuro: alert & oriented x 3, cranial nerves grossly intact. moves all 4 extremities w/o difficulty. Affect pleasant.  ECG: NSR 78bpm, flattened T-waves in V2, low voltage EKG  Results for orders placed or performed during the hospital encounter of 06/19/18 (from the past 24 hour(s))  CBC with Differential     Status: None   Collection Time: 06/19/18  9:04 PM  Result Value Ref Range   WBC 10.5 4.0 - 10.5 K/uL   RBC 4.37 3.87 - 5.11 MIL/uL   Hemoglobin 13.1 12.0 - 15.0 g/dL   HCT 16.139.2 09.636.0 - 04.546.0 %   MCV 89.7 80.0 - 100.0 fL   MCH 30.0 26.0 - 34.0 pg   MCHC 33.4 30.0 - 36.0 g/dL   RDW 40.912.6 81.111.5 - 91.415.5 %   Platelets 218 150 - 400 K/uL   nRBC 0.0 0.0 - 0.2 %   Neutrophils Relative % 65 %    Neutro Abs 6.7 1.7 - 7.7 K/uL   Lymphocytes Relative 26 %   Lymphs Abs 2.7 0.7 - 4.0 K/uL   Monocytes Relative 7 %   Monocytes Absolute 0.8 0.1 - 1.0 K/uL   Eosinophils Relative 2 %   Eosinophils Absolute 0.2 0.0 - 0.5 K/uL   Basophils Relative 0 %   Basophils Absolute 0.0 0.0 - 0.1 K/uL   Immature Granulocytes 0 %   Abs Immature Granulocytes 0.03 0.00 - 0.07 K/uL  Comprehensive metabolic panel     Status: Abnormal   Collection Time: 06/19/18  9:04 PM  Result Value Ref Range   Sodium 141 135 - 145 mmol/L   Potassium 3.6 3.5 - 5.1 mmol/L   Chloride 109 98 - 111 mmol/L   CO2 23 22 - 32 mmol/L   Glucose, Bld 106 (H) 70 - 99 mg/dL   BUN 13 8 - 23 mg/dL   Creatinine, Ser 7.820.76 0.44 - 1.00 mg/dL   Calcium 9.2 8.9 - 95.610.3 mg/dL   Total Protein 7.3 6.5 - 8.1 g/dL   Albumin 3.8 3.5 - 5.0 g/dL   AST 40 15 - 41 U/L   ALT 33 0 - 44 U/L   Alkaline Phosphatase 118 38 - 126 U/L   Total Bilirubin 0.5 0.3 - 1.2 mg/dL   GFR calc non Af Amer >60 >60 mL/min   GFR calc Af Amer >60 >60 mL/min   Anion gap 9 5 - 15  Troponin I - ONCE - STAT     Status: Abnormal   Collection Time: 06/19/18  9:04 PM  Result Value Ref Range   Troponin I 0.85 (HH) <0.03 ng/mL  Protime-INR     Status: None   Collection Time: 06/19/18 10:05 PM  Result Value Ref Range   Prothrombin Time 14.0 11.4 - 15.2 seconds   INR 1.1 0.8 - 1.2  APTT     Status: None   Collection Time: 06/19/18 10:05 PM  Result Value Ref Range   aPTT 30 24 - 36 seconds  Troponin I - Now Then Q6H     Status: Abnormal   Collection Time: 06/20/18  3:12 AM  Result Value Ref Range   Troponin I 4.96 (HH) <0.03 ng/mL  Basic metabolic panel     Status: None   Collection Time: 06/20/18  6:46 AM  Result Value Ref  Range   Sodium 141 135 - 145 mmol/L   Potassium 4.2 3.5 - 5.1 mmol/L   Chloride 109 98 - 111 mmol/L   CO2 25 22 - 32 mmol/L   Glucose, Bld 98 70 - 99 mg/dL   BUN 9 8 - 23 mg/dL   Creatinine, Ser 4.030.78 0.44 - 1.00 mg/dL   Calcium 9.3 8.9  - 47.410.3 mg/dL   GFR calc non Af Amer >60 >60 mL/min   GFR calc Af Amer >60 >60 mL/min   Anion gap 7 5 - 15  CBC     Status: None   Collection Time: 06/20/18  6:46 AM  Result Value Ref Range   WBC 9.7 4.0 - 10.5 K/uL   RBC 4.48 3.87 - 5.11 MIL/uL   Hemoglobin 13.3 12.0 - 15.0 g/dL   HCT 25.940.3 56.336.0 - 87.546.0 %   MCV 90.0 80.0 - 100.0 fL   MCH 29.7 26.0 - 34.0 pg   MCHC 33.0 30.0 - 36.0 g/dL   RDW 64.312.6 32.911.5 - 51.815.5 %   Platelets 216 150 - 400 K/uL   nRBC 0.0 0.0 - 0.2 %  Troponin I - Now Then Q6H     Status: Abnormal   Collection Time: 06/20/18  6:46 AM  Result Value Ref Range   Troponin I 7.03 (HH) <0.03 ng/mL   Ct Angio Chest Aorta W And/or Wo Contrast  Result Date: 06/19/2018 CLINICAL DATA:  Concern for aortic dissection.  Chest pain. EXAM: CT ANGIOGRAPHY CHEST WITH CONTRAST TECHNIQUE: Multidetector CT imaging of the chest was performed using the standard protocol during bolus administration of intravenous contrast. Multiplanar CT image reconstructions and MIPs were obtained to evaluate the vascular anatomy. CONTRAST:  100mL ISOVUE-370 IOPAMIDOL (ISOVUE-370) INJECTION 76% COMPARISON:  None. FINDINGS: Cardiovascular: There mild aortic calcifications. There is no PE. The heart size is normal. The main pulmonary artery is not significantly dilated. There is no evidence of a thoracic aortic aneurysm. The thoracic aorta is somewhat tortuous. There is no significant pericardial effusion. Mediastinum/Nodes: There are no pathologically enlarged mediastinal or hilar lymph nodes. No pathologically enlarged axillary lymph nodes. The thyroid gland is diffusely heterogeneous with multiple thyroid nodules. Lungs/Pleura: Lungs are clear. No pleural effusion or pneumothorax. Upper Abdomen: There is a lipid rich benign adrenal adenoma involving the left adrenal gland measuring approximately 1.8 cm. The right adrenal gland is unremarkable. The remaining portions of the upper abdomen are unremarkable. Musculoskeletal:  No chest wall abnormality. No acute or significant osseous findings. Review of the MIP images confirms the above findings. IMPRESSION: 1. No dissection.  No PE. 2. The lungs are clear. 3. Enlarged multinodular thyroid gland. Follow-up with nonemergent outpatient thyroid ultrasound is recommended for further evaluation. Aortic Atherosclerosis (ICD10-I70.0). Electronically Signed   By: Katherine Mantlehristopher  Green M.D.   On: 06/19/2018 22:48     ASSESSMENT AND PLAN:  NSTEMI:  Elevated troponin to 7.03.  Echo results pending,  schedule cardiac cath tomorrow morning. Continue Brilinta, aspirin, metoprolol, and atorvastatin.   Hypertension: Mild-moderate HTN, No changes at this time, continue to monitor.   Caroleen HammanKristin Rachael Ferrie, NP-C Cell: [phone]

## 2018-06-20 NOTE — Progress Notes (Signed)
[employer] - [place] [facility]   PATIENT NAME: Megan Rosales    MR#:  [id number]  DATE OF BIRTH:  02/28/1950  SUBJECTIVE:  CHIEF COMPLAINT:   Chief Complaint  Patient presents with  . Chest Pain   No CP today Waiting for cardiology evaluation NPO  REVIEW OF SYSTEMS:    Review of Systems  Constitutional: Positive for malaise/fatigue. Negative for chills and fever.  HENT: Negative for sore throat.   Eyes: Negative for blurred vision, double vision and pain.  Respiratory: Negative for cough, hemoptysis, shortness of breath and wheezing.   Cardiovascular: Positive for chest pain. Negative for palpitations, orthopnea and leg swelling.  Gastrointestinal: Negative for abdominal pain, constipation, diarrhea, heartburn, nausea and vomiting.  Genitourinary: Negative for dysuria and hematuria.  Musculoskeletal: Negative for back pain and joint pain.  Skin: Negative for rash.  Neurological: Negative for sensory change, speech change, focal weakness and headaches.  Endo/Heme/Allergies: Does not bruise/bleed easily.  Psychiatric/Behavioral: Negative for depression. The patient is not nervous/anxious.     DRUG ALLERGIES:   Allergies  Allergen Reactions  . Penicillins     Makes genitals burn    VITALS:  Blood pressure (!) 152/94, pulse 72, temperature 98 F (36.7 C), temperature source Oral, resp. rate 14, height 5\' 1"  (1.549 m), weight 77.1 kg, SpO2 96 %.  PHYSICAL EXAMINATION:   Physical Exam  GENERAL:  68 y.o.-year-old patient lying in the bed with no acute distress.  EYES: Pupils equal, round, reactive to light and accommodation. No scleral icterus. Extraocular muscles intact.  HEENT: Head atraumatic, normocephalic. Oropharynx and nasopharynx clear.  NECK:  Supple, no jugular venous distention. No thyroid enlargement, no tenderness.  LUNGS: Normal breath sounds bilaterally, no wheezing, rales, rhonchi. No use of accessory muscles of respiration.   CARDIOVASCULAR: S1, S2 normal. No murmurs, rubs, or gallops.  ABDOMEN: Soft, nontender, nondistended. Bowel sounds present. No organomegaly or mass.  EXTREMITIES: No cyanosis, clubbing or edema b/l.    NEUROLOGIC: Cranial nerves II through XII are intact. No focal Motor or sensory deficits b/l.   PSYCHIATRIC: The patient is alert and oriented x 3.  SKIN: No obvious rash, lesion, or ulcer.   LABORATORY PANEL:   CBC Recent Labs  Lab 06/20/18 0646  WBC 9.7  HGB 13.3  HCT 40.3  PLT 216   ------------------------------------------------------------------------------------------------------------------ Chemistries  Recent Labs  Lab 06/19/18 2104 06/20/18 0646  NA 141 141  K 3.6 4.2  CL 109 109  CO2 23 25  GLUCOSE 106* 98  BUN 13 9  CREATININE 0.76 0.78  CALCIUM 9.2 9.3  AST 40  --   ALT 33  --   ALKPHOS 118  --   BILITOT 0.5  --    ------------------------------------------------------------------------------------------------------------------  Cardiac Enzymes Recent Labs  Lab 06/20/18 0646  TROPONINI 7.03*   ------------------------------------------------------------------------------------------------------------------  RADIOLOGY:  Ct Angio Chest Aorta W And/or Wo Contrast  Result Date: 06/19/2018 CLINICAL DATA:  Concern for aortic dissection.  Chest pain. EXAM: CT ANGIOGRAPHY CHEST WITH CONTRAST TECHNIQUE: Multidetector CT imaging of the chest was performed using the standard protocol during bolus administration of intravenous contrast. Multiplanar CT image reconstructions and MIPs were obtained to evaluate the vascular anatomy. CONTRAST:  100mL ISOVUE-370 IOPAMIDOL (ISOVUE-370) INJECTION 76% COMPARISON:  None. FINDINGS: Cardiovascular: There mild aortic calcifications. There is no PE. The heart size is normal. The main pulmonary artery is not significantly dilated. There is no evidence of a thoracic aortic aneurysm. The thoracic aorta is somewhat tortuous. There  is  no significant pericardial effusion. Mediastinum/Nodes: There are no pathologically enlarged mediastinal or hilar lymph nodes. No pathologically enlarged axillary lymph nodes. The thyroid gland is diffusely heterogeneous with multiple thyroid nodules. Lungs/Pleura: Lungs are clear. No pleural effusion or pneumothorax. Upper Abdomen: There is a lipid rich benign adrenal adenoma involving the left adrenal gland measuring approximately 1.8 cm. The right adrenal gland is unremarkable. The remaining portions of the upper abdomen are unremarkable. Musculoskeletal: No chest wall abnormality. No acute or significant osseous findings. Review of the MIP images confirms the above findings. IMPRESSION: 1. No dissection.  No PE. 2. The lungs are clear. 3. Enlarged multinodular thyroid gland. Follow-up with nonemergent outpatient thyroid ultrasound is recommended for further evaluation. Aortic Atherosclerosis (ICD10-I70.0). Electronically Signed   By: Constance Holster M.D.   On: 06/19/2018 22:48   ASSESSMENT AND PLAN:   1.  Non-STEMI.   Telemetry monitoring ASA, Metoprolol, statin Lovenox Discussed with [facility]  2.  HTN Started on metoprolol  3.  Depression.  This is apparently fairly controlled.  4.  DVT prophylaxis.   Lovenox.  All the records are reviewed and case discussed with Care Management/Social Worker. Management plans discussed with the patient, daughter over phoneand they are in agreement.  CODE STATUS: FULL CODE  TOTAL TIME TAKING CARE OF THIS PATIENT: 35 minutes.   POSSIBLE D/C IN 1-2 DAYS, DEPENDING ON CLINICAL CONDITION.  Leia Alf Tienna Bienkowski M.D on 06/20/2018 at 11:11 AM  Between 7am to 6pm - Pager - [phone]  After 6pm go to [email or web address] - password [email or web address] [place] [facility]  Office  [phone]  CC: Primary care physician; Patient, No Pcp Per  Note: This dictation was prepared with Dragon dictation along with smaller phrase technology. Any  transcriptional errors that result from this process are unintentional.

## 2018-06-20 NOTE — ED Notes (Signed)
ED TO INPATIENT HANDOFF REPORT  ED Nurse Name and Phone #: Tobi Bastosnna [phone]  S Name/Age/Gender Megan Rosales 68 y.o. female Room/Bed: ED19A/ED19A  Code Status   Code Status: Full Code  Home/SNF/Other Home Patient oriented to: self, place, time and situation Is this baseline? Yes   Triage Complete: Triage complete  Chief Complaint chest pain   Triage Note Pt to triage via w/c, mask in place with no distress noted; pt reports several days having left sided CP radiating into back and neck with no accomp symptoms; denies hx of same   Allergies Allergies  Allergen Reactions  . Penicillins     Makes genitals burn    Level of Care/Admitting Diagnosis ED Disposition    ED Disposition Condition Comment   Admit  Hospital Area: [facility] [[id number]]  Level of Care: Telemetry [5]  Covid Evaluation: Screening Protocol (No Symptoms)  Diagnosis: NSTEMI (non-ST elevated myocardial infarction) [facility](HCC) [[id number]]) [[id number]]  Admitting Physician: Hannah BeatMANSY, JAN A [[id number]]  Attending Physician: Hannah BeatMANSY, JAN A [[id number]]  Estimated length of stay: past midnight tomorrow  Certification:: I certify this patient will need inpatient services for at least 2 midnights  PT Class (Do Not Modify): Inpatient [101]  PT Acc Code (Do Not Modify): Private [1]       B Medical/Surgery History Past Medical History:  Diagnosis Date  . Depression    Past Surgical History:  Procedure Laterality Date  . ABDOMINAL HYSTERECTOMY    . MASS EXCISION Left 12/19/2017   Procedure: EXCISION LEFT AXILLARY CYST;  Surgeon: Sung AmabileSakai, Isami, DO;  Location: [facility] ORS;  Service: General;  Laterality: Left;     A IV Location/Drains/Wounds Patient Lines/Drains/Airways Status   Active Line/Drains/Airways    Name:   Placement date:   Placement time:   Site:   Days:   Peripheral IV 06/20/18 Left Forearm   06/20/18    0648    Forearm   less than 1   Incision (Closed) 12/19/17 Axilla Left   12/19/17    0824     183           Intake/Output Last 24 hours  Intake/Output Summary (Last 24 hours) at 06/20/2018 1447 Last data filed at 06/20/2018 1205 Gross per 24 hour  Intake 525.18 ml  Output -  Net 525.18 ml    Labs/Imaging Results for orders placed or performed during the hospital encounter of 06/19/18 (from the past 48 hour(s))  CBC with Differential     Status: None   Collection Time: 06/19/18  9:04 PM  Result Value Ref Range   WBC 10.5 4.0 - 10.5 K/uL   RBC 4.37 3.87 - 5.11 MIL/uL   Hemoglobin 13.1 12.0 - 15.0 g/dL   HCT 28.439.2 13.236.0 - 44.046.0 %   MCV 89.7 80.0 - 100.0 fL   MCH 30.0 26.0 - 34.0 pg   MCHC 33.4 30.0 - 36.0 g/dL   RDW 10.212.6 72.511.5 - 36.615.5 %   Platelets 218 150 - 400 K/uL   nRBC 0.0 0.0 - 0.2 %   Neutrophils Relative % 65 %   Neutro Abs 6.7 1.7 - 7.7 K/uL   Lymphocytes Relative 26 %   Lymphs Abs 2.7 0.7 - 4.0 K/uL   Monocytes Relative 7 %   Monocytes Absolute 0.8 0.1 - 1.0 K/uL   Eosinophils Relative 2 %   Eosinophils Absolute 0.2 0.0 - 0.5 K/uL   Basophils Relative 0 %   Basophils Absolute 0.0 0.0 - 0.1 K/uL   Immature  Granulocytes 0 %   Abs Immature Granulocytes 0.03 0.00 - 0.07 K/uL    Comment: Performed at [facility], [address] [place], [place] [address]  Comprehensive metabolic panel     Status: Abnormal   Collection Time: 06/19/18  9:04 PM  Result Value Ref Range   Sodium 141 135 - 145 mmol/L   Potassium 3.6 3.5 - 5.1 mmol/L   Chloride 109 98 - 111 mmol/L   CO2 23 22 - 32 mmol/L   Glucose, Bld 106 (H) 70 - 99 mg/dL   BUN 13 8 - 23 mg/dL   Creatinine, Ser 7.250.76 0.44 - 1.00 mg/dL   Calcium 9.2 8.9 - 36.610.3 mg/dL   Total Protein 7.3 6.5 - 8.1 g/dL   Albumin 3.8 3.5 - 5.0 g/dL   AST 40 15 - 41 U/L   ALT 33 0 - 44 U/L   Alkaline Phosphatase 118 38 - 126 U/L   Total Bilirubin 0.5 0.3 - 1.2 mg/dL   GFR calc non Af Amer >60 >60 mL/min   GFR calc Af Amer >60 >60 mL/min   Anion gap 9 5 - 15    Comment: Performed at [facility], [address]  [place], [place] [address]  Troponin I - ONCE - STAT     Status: Abnormal   Collection Time: 06/19/18  9:04 PM  Result Value Ref Range   Troponin I 0.85 (HH) <0.03 ng/mL    Comment: CRITICAL RESULT CALLED TO, READ BACK BY AND VERIFIED WITH AMY COHEN @2133  06/19/18 MJU Performed at [facility], [address] [place], [place] [address]   Protime-INR     Status: None   Collection Time: 06/19/18 10:05 PM  Result Value Ref Range   Prothrombin Time 14.0 11.4 - 15.2 seconds   INR 1.1 0.8 - 1.2    Comment: (NOTE) INR goal varies based on device and disease states. Performed at [facility], [address] [place], [place] [address]   APTT     Status: None   Collection Time: 06/19/18 10:05 PM  Result Value Ref Range   aPTT 30 24 - 36 seconds    Comment: Performed at [facility], [address] [place], [place] [address]  Troponin I - Now Then Q6H     Status: Abnormal   Collection Time: 06/20/18  3:12 AM  Result Value Ref Range   Troponin I 4.96 (HH) <0.03 ng/mL    Comment: CRITICAL RESULT CALLED TO, READ BACK BY AND VERIFIED WITH GRACIE WINEMAN 0356 ON 06/20/2018 [facility]. Performed at [facility], [address] [place], [place] [address]   Basic metabolic panel     Status: None   Collection Time: 06/20/18  6:46 AM  Result Value Ref Range   Sodium 141 135 - 145 mmol/L   Potassium 4.2 3.5 - 5.1 mmol/L   Chloride 109 98 - 111 mmol/L   CO2 25 22 - 32 mmol/L   Glucose, Bld 98 70 - 99 mg/dL   BUN 9 8 - 23 mg/dL   Creatinine, Ser 8.410.78 0.44 - 1.00 mg/dL   Calcium 9.3 8.9 - 66.010.3 mg/dL   GFR calc non Af Amer >60 >60 mL/min   GFR calc Af Amer >60 >60 mL/min   Anion gap 7 5 - 15    Comment: Performed at [facility], [address] [place], [place] [address]  CBC     Status: None   Collection Time: 06/20/18  6:46 AM  Result Value Ref Range   WBC 9.7 4.0 - 10.5 K/uL   RBC 4.48 3.87 - 5.11 MIL/uL   Hemoglobin 13.3 12.0 - 15.0 g/dL   HCT  16.140.3 09.636.0 - 04.546.0 %   MCV 90.0 80.0 - 100.0 fL   MCH 29.7 26.0 - 34.0 pg   MCHC 33.0 30.0 - 36.0 g/dL   RDW 40.912.6 81.111.5 - 91.415.5 %   Platelets 216 150 - 400 K/uL   nRBC 0.0 0.0 - 0.2 %    Comment: Performed at [facility], [address] [place], [place] [address]  Troponin I - Now Then Q6H     Status: Abnormal   Collection Time: 06/20/18  6:46 AM  Result Value Ref Range   Troponin I 7.03 (HH) <0.03 ng/mL    Comment: CRITICAL VALUE NOTED. VALUE IS CONSISTENT WITH PREVIOUSLY REPORTED/CALLED VALUE...[facility] Performed at [facility], [address] [place], [place] [address]    Ct Angio Chest Aorta W And/or Wo Contrast  Result Date: 06/19/2018 CLINICAL DATA:  Concern for aortic dissection.  Chest pain. EXAM: CT ANGIOGRAPHY CHEST WITH CONTRAST TECHNIQUE: Multidetector CT imaging of the chest was performed using the standard protocol during bolus administration of intravenous contrast. Multiplanar CT image reconstructions and MIPs were obtained to evaluate the vascular anatomy. CONTRAST:  100mL ISOVUE-370 IOPAMIDOL (ISOVUE-370) INJECTION 76% COMPARISON:  None. FINDINGS: Cardiovascular: There mild aortic calcifications. There is no PE. The heart size is normal. The main pulmonary artery is not significantly dilated. There is no evidence of a thoracic aortic aneurysm. The thoracic aorta is somewhat tortuous. There is no significant pericardial effusion. Mediastinum/Nodes: There are no pathologically enlarged mediastinal or hilar lymph nodes. No pathologically enlarged axillary lymph nodes. The thyroid gland is diffusely heterogeneous with multiple thyroid nodules. Lungs/Pleura: Lungs are clear. No pleural effusion or pneumothorax. Upper Abdomen: There is a lipid rich benign adrenal adenoma involving the left adrenal gland measuring approximately 1.8 cm. The right adrenal gland is unremarkable. The remaining portions of the upper abdomen are unremarkable. Musculoskeletal: No chest wall  abnormality. No acute or significant osseous findings. Review of the MIP images confirms the above findings. IMPRESSION: 1. No dissection.  No PE. 2. The lungs are clear. 3. Enlarged multinodular thyroid gland. Follow-up with nonemergent outpatient thyroid ultrasound is recommended for further evaluation. Aortic Atherosclerosis (ICD10-I70.0). Electronically Signed   By: Katherine Mantlehristopher  Green M.D.   On: 06/19/2018 22:48    Pending Labs Unresulted Labs (From admission, onward)    Start     Ordered   06/19/18 2349  HIV antibody (Routine Testing)  Once,   STAT     06/19/18 2355   06/19/18 2153  Novel Coronavirus,NAA,(SEND-OUT TO REF LAB - TAT 24-48 hrs); Hosp Order  (Asymptomatic Patients Labs)  ONCE - STAT,   STAT    Question:  Rule Out  Answer:  Yes   06/19/18 2152          Vitals/Pain Today's Vitals   06/20/18 1230 06/20/18 1300 06/20/18 1330 06/20/18 1400  BP: (!) 143/69 (!) 138/113 (!) 143/90 129/85  Pulse: (!) 56     Resp: 11 12 20 18   Temp:      TempSrc:      SpO2: 99%     Weight:      Height:      PainSc:        Isolation Precautions No active isolations  Medications Medications  enoxaparin (LOVENOX) injection 75 mg (75 mg Subcutaneous Given  06/20/18 1049)  acetaminophen (TYLENOL) tablet 650 mg (has no administration in time range)    Or  acetaminophen (TYLENOL) suppository 650 mg (has no administration in time range)  traZODone (DESYREL) tablet 25 mg (has no administration in time range)  magnesium hydroxide (MILK OF MAGNESIA) suspension 30 mL (has no administration in time range)  ondansetron (ZOFRAN) tablet 4 mg (has no administration in time range)    Or  ondansetron (ZOFRAN) injection 4 mg (has no administration in time range)  ticagrelor (BRILINTA) tablet 90 mg (90 mg Oral Given 06/20/18 1045)  metoprolol tartrate (LOPRESSOR) tablet 25 mg (25 mg Oral Given 06/20/18 1045)  atorvastatin (LIPITOR) tablet 40 mg (0 mg Oral Hold 06/20/18 0301)  nitroGLYCERIN (NITROSTAT) SL  tablet 0.4 mg (has no administration in time range)  morphine 10 MG/5ML solution 2 mg (has no administration in time range)  aspirin EC tablet 81 mg (81 mg Oral Given 06/20/18 1045)  aspirin chewable tablet 324 mg (324 mg Oral Given 06/19/18 2148)  iopamidol (ISOVUE-370) 76 % injection 100 mL (100 mLs Intravenous Contrast Given 06/19/18 2227)  ticagrelor (BRILINTA) tablet 180 mg (180 mg Oral Given 06/19/18 2340)    Mobility walks Low fall risk   Focused Assessments Cardiac Assessment Handoff:    Lab Results  Component Value Date   TROPONINI 7.03 ([place]) 06/20/2018   No results found for: DDIMER Does the Patient currently have chest pain? No     R Recommendations: See Admitting Provider Note  Report given to:   Additional Notes:

## 2018-06-20 NOTE — ED Notes (Signed)
Patient has been in contact with her daughter by cellphone to keep her updated on care.

## 2018-06-20 NOTE — ED Notes (Signed)
Date and time results received: 06/20/18 0355 (use smartphrase ".now" to insert current time)  Test: troponin Critical Value: 4.96  Name of Provider Notified: mansy  Orders Received? Or Actions Taken?: monitor

## 2018-06-20 NOTE — Progress Notes (Signed)
Advance care planning  Purpose of Encounter NSTEMI  Parties in Attendance Patient  Patients Decisional capacity Alert and oriented.  Able to make medical decisions.  No healthcare power of attorney paperwork in place  She wants her daughter Megan Rosales to make decisions if she is unable to.  Discussed in detail regarding NSTEMI.  Treatment plan , prognosis discussed.  All questions answered  We discussed regarding CODE STATUS and patient requests aggressive medical care with CPR/defibrillation/ventilatory support if needed  Orders entered and CODE STATUS changed  FULL CODE  Time spent - 17 minutes

## 2018-06-21 ENCOUNTER — Encounter
Admission: EM | Disposition: A | Discharge status: Home / Self Care | Payer: PRIVATE HEALTH INSURANCE | Source: Home / Self Care | Attending: Internal Medicine

## 2018-06-21 HISTORY — PX: LEFT HEART CATH AND CORONARY ANGIOGRAPHY: CATH118249

## 2018-06-21 LAB — ECHOCARDIOGRAM COMPLETE
Height: 61 in
Weight: 2720 oz

## 2018-06-21 LAB — NOVEL CORONAVIRUS, NAA (HOSP ORDER, SEND-OUT TO REF LAB; TAT 18-24 HRS): SARS-CoV-2, NAA: NOT DETECTED

## 2018-06-21 SURGERY — LEFT HEART CATH AND CORONARY ANGIOGRAPHY
Anesthesia: Moderate Sedation | Laterality: Left

## 2018-06-21 MED ORDER — METOPROLOL TARTRATE 25 MG PO TABS: 25.0000 mg | ORAL_TABLET | Freq: Two times a day (BID) | ORAL | 0 refills | Status: DC

## 2018-06-21 MED ORDER — ACETAMINOPHEN 325 MG PO TABS: 650.0000 mg | ORAL_TABLET | ORAL | Status: DC | PRN

## 2018-06-21 MED ORDER — IOHEXOL 300 MG/ML  SOLN
INTRAMUSCULAR | Status: DC | PRN
  Administered 2018-06-21: 125 mL via INTRA_ARTERIAL

## 2018-06-21 MED ORDER — FENTANYL CITRATE (PF) 100 MCG/2ML IJ SOLN
INTRAMUSCULAR | Status: AC
  Filled 2018-06-21: qty 2

## 2018-06-21 MED ORDER — MIDAZOLAM HCL 2 MG/2ML IJ SOLN
INTRAMUSCULAR | Status: AC
  Filled 2018-06-21: qty 2

## 2018-06-21 MED ORDER — SODIUM CHLORIDE 0.9 % IV SOLN: 250.0000 mL | INTRAVENOUS | Status: DC | PRN

## 2018-06-21 MED ORDER — CLOPIDOGREL BISULFATE 75 MG PO TABS
75.0000 mg | ORAL_TABLET | Freq: Every day | ORAL | Status: DC
  Administered 2018-06-21: 75 mg via ORAL
  Filled 2018-06-21: qty 1

## 2018-06-21 MED ORDER — LIDOCAINE HCL (PF) 1 % IJ SOLN
INTRAMUSCULAR | Status: DC | PRN
  Administered 2018-06-21: 20 mL

## 2018-06-21 MED ORDER — LISINOPRIL 2.5 MG PO TABS: 2.5000 mg | ORAL_TABLET | Freq: Every day | ORAL | 0 refills | Status: DC

## 2018-06-21 MED ORDER — ASPIRIN 81 MG PO CHEW
81.0000 mg | CHEWABLE_TABLET | ORAL | Status: AC
  Administered 2018-06-21: 81 mg via ORAL

## 2018-06-21 MED ORDER — SODIUM CHLORIDE 0.9 % WEIGHT BASED INFUSION
3.0000 mL/kg/h | INTRAVENOUS | Status: DC
  Administered 2018-06-21: 3 mL/kg/h via INTRAVENOUS

## 2018-06-21 MED ORDER — SODIUM CHLORIDE 0.9% FLUSH: 3.0000 mL | INTRAVENOUS | Status: DC | PRN

## 2018-06-21 MED ORDER — ISOSORBIDE MONONITRATE ER 30 MG PO TB24: 15.0000 mg | ORAL_TABLET | Freq: Every day | ORAL | 0 refills | Status: DC

## 2018-06-21 MED ORDER — SODIUM CHLORIDE 0.9% FLUSH: 3.0000 mL | Freq: Two times a day (BID) | INTRAVENOUS | Status: DC

## 2018-06-21 MED ORDER — LABETALOL HCL 5 MG/ML IV SOLN: 10.0000 mg | INTRAVENOUS | Status: AC | PRN

## 2018-06-21 MED ORDER — ONDANSETRON HCL 4 MG/2ML IJ SOLN: 4.0000 mg | Freq: Four times a day (QID) | INTRAMUSCULAR | Status: DC | PRN

## 2018-06-21 MED ORDER — HYDRALAZINE HCL 20 MG/ML IJ SOLN: 10.0000 mg | INTRAMUSCULAR | Status: AC | PRN

## 2018-06-21 MED ORDER — ATORVASTATIN CALCIUM 40 MG PO TABS: 40.0000 mg | ORAL_TABLET | Freq: Every day | ORAL | 0 refills | Status: DC

## 2018-06-21 MED ORDER — ISOSORBIDE MONONITRATE ER 30 MG PO TB24
15.0000 mg | ORAL_TABLET | Freq: Every day | ORAL | Status: DC
  Administered 2018-06-21: 15 mg via ORAL
  Filled 2018-06-21: qty 1

## 2018-06-21 MED ORDER — HEPARIN (PORCINE) IN NACL 1000-0.9 UT/500ML-% IV SOLN
INTRAVENOUS | Status: AC
  Filled 2018-06-21: qty 1000

## 2018-06-21 MED ORDER — MIDAZOLAM HCL 2 MG/2ML IJ SOLN
INTRAMUSCULAR | Status: DC | PRN
  Administered 2018-06-21: 1 mg via INTRAVENOUS

## 2018-06-21 MED ORDER — SODIUM CHLORIDE 0.9 % WEIGHT BASED INFUSION: 1.0000 mL/kg/h | INTRAVENOUS | Status: DC

## 2018-06-21 MED ORDER — ASPIRIN 81 MG PO TBEC: 81.0000 mg | DELAYED_RELEASE_TABLET | Freq: Every day | ORAL | 0 refills | Status: DC

## 2018-06-21 MED ORDER — FENTANYL CITRATE (PF) 100 MCG/2ML IJ SOLN
INTRAMUSCULAR | Status: DC | PRN
  Administered 2018-06-21: 25 ug via INTRAVENOUS

## 2018-06-21 MED ORDER — HEPARIN (PORCINE) IN NACL 1000-0.9 UT/500ML-% IV SOLN
INTRAVENOUS | Status: DC | PRN
  Administered 2018-06-21 (×2): 500 mL

## 2018-06-21 MED ORDER — CLOPIDOGREL BISULFATE 75 MG PO TABS: 75.0000 mg | ORAL_TABLET | Freq: Every day | ORAL | 0 refills | Status: DC

## 2018-06-21 MED ORDER — ASPIRIN 81 MG PO CHEW
CHEWABLE_TABLET | ORAL | Status: AC
  Administered 2018-06-21: 14:00:00
  Filled 2018-06-21: qty 1

## 2018-06-21 SURGICAL SUPPLY — 10 items
CATH INFINITI 5 FR 3DRC (CATHETERS) ×3 IMPLANT
CATH INFINITI 5FR ANG PIGTAIL (CATHETERS) ×3 IMPLANT
CATH INFINITI 5FR JL4 (CATHETERS) ×3 IMPLANT
CATH INFINITI JR4 5F (CATHETERS) ×3 IMPLANT
DEVICE CLOSURE MYNXGRIP 5F (Vascular Products) ×3 IMPLANT
KIT MANI 3VAL PERCEP (MISCELLANEOUS) ×3 IMPLANT
NEEDLE PERC 18GX7CM (NEEDLE) ×3 IMPLANT
PACK CARDIAC CATH (CUSTOM PROCEDURE TRAY) ×3 IMPLANT
SHEATH AVANTI 5FR X 11CM (SHEATH) ×3 IMPLANT
WIRE GUIDERIGHT .035X150 (WIRE) ×3 IMPLANT

## 2018-06-21 NOTE — Progress Notes (Signed)
SUBJECTIVE: Patient denies any chest pain or shortness of breath   Vitals:   06/21/18 0432 06/21/18 0804 06/21/18 0944 06/21/18 1026  BP: (!) 145/82 134/73 135/77   Pulse: 65 (!) 57 64   Resp:  17 14   Temp: 98.3 F (36.8 C) 98.6 F (37 C) 98.9 F (37.2 C)   TempSrc: Oral Oral Oral   SpO2: 92% 100% 99% 100%  Weight: 76.8 kg     Height:        Intake/Output Summary (Last 24 hours) at 06/21/2018 1028 Last data filed at 06/21/2018 0437 Gross per 24 hour  Intake 525.18 ml  Output 300 ml  Net 225.18 ml    LABS: Basic Metabolic Panel: Recent Labs    06/19/18 2104 06/20/18 0646  NA 141 141  K 3.6 4.2  CL 109 109  CO2 23 25  GLUCOSE 106* 98  BUN 13 9  CREATININE 0.76 0.78  CALCIUM 9.2 9.3   Liver Function Tests: Recent Labs    06/19/18 2104  AST 40  ALT 33  ALKPHOS 118  BILITOT 0.5  PROT 7.3  ALBUMIN 3.8   No results for input(s): LIPASE, AMYLASE in the last 72 hours. CBC: Recent Labs    06/19/18 2104 06/20/18 0646  WBC 10.5 9.7  NEUTROABS 6.7  --   HGB 13.1 13.3  HCT 39.2 40.3  MCV 89.7 90.0  PLT 218 216   Cardiac Enzymes: Recent Labs    06/20/18 0646 06/20/18 1544 06/20/18 2210  TROPONINI 7.03* 6.27* 5.54*   BNP: Invalid input(s): POCBNP D-Dimer: No results for input(s): DDIMER in the last 72 hours. Hemoglobin A1C: No results for input(s): HGBA1C in the last 72 hours. Fasting Lipid Panel: No results for input(s): CHOL, HDL, LDLCALC, TRIG, CHOLHDL, LDLDIRECT in the last 72 hours. Thyroid Function Tests: No results for input(s): TSH, T4TOTAL, T3FREE, THYROIDAB in the last 72 hours.  Invalid input(s): FREET3 Anemia Panel: No results for input(s): VITAMINB12, FOLATE, FERRITIN, TIBC, IRON, RETICCTPCT in the last 72 hours.   PHYSICAL EXAM General: Well developed, well nourished, in no acute distress HEENT:  Normocephalic and atramatic Neck:  No JVD.  Lungs: Clear bilaterally to auscultation and percussion. Heart: HRRR . Normal S1 and S2  without gallops or murmurs.  Abdomen: Bowel sounds are positive, abdomen soft and non-tender  Msk:  Back normal, normal gait. Normal strength and tone for age. Extremities: No clubbing, cyanosis or edema.   Neuro: Alert and oriented X 3. Psych:  Good affect, responds appropriately  TELEMETRY: Normal sinus rhythm  ASSESSMENT AND PLAN: Non-STEMI with chest pain clinically stable and will undergo cardiac catheterization today.  Active Problems:   NSTEMI (non-ST elevated myocardial infarction) ([place])    Neoma Laming A, MD, [facility] 06/21/2018 10:28 AM

## 2018-06-21 NOTE — Discharge Instructions (Addendum)
Acute Coronary Syndrome  Acute coronary syndrome (ACS) is a serious problem in which there is suddenly not enough blood and oxygen reaching the heart. ACS can result in chest pain or a heart attack. This condition is a medical emergency. If you have any symptoms of this condition, get help right away. What are the causes? This condition may be caused by:  Buildup of fat and cholesterol inside of the arteries (atherosclerosis). This is the most common cause. The buildup (plaque) can cause blood vessels in the heart (coronary arteries) to become narrow or blocked, which reduces blood flow to the heart. Plaque can also break off and lead to a clot, which can block an artery and cause a heart attack or stroke.  Sudden tightening of the muscles around the coronary arteries (coronary spasm).  Tearing of a coronary artery (spontaneous coronary artery dissection).  Very low blood pressure (hypotension).  An abnormal heartbeat (arrhythmia).  Other medical conditions that cause a decrease of oxygen to the heart, such as anemiaorrespiratory failure.  Using cocaine or methamphetamine. What increases the risk? The following factors may make you more likely to develop this condition:  Age. The risk for ACS increases as you get older.  History of chest pain, heart attack, peripheral artery disease, or stroke.  Having taken chemotherapy or immune-suppressing medicines.  Being female.  Family history of chest pain, heart disease, or stroke.  Smoking.  Not exercising enough.  Being overweight.  High cholesterol.  High blood pressure (hypertension).  Diabetes.  Excessive alcohol use. What are the signs or symptoms? Common symptoms of this condition include:  Chest pain. The pain may last a long time, or it may stop and come back (recur). It may feel like: ? Crushing or squeezing. ? Tightness, pressure, fullness, or heaviness.  Arm, neck, jaw, or back pain.  Heartburn or  indigestion.  Shortness of breath.  Nausea.  Sudden cold sweats.  Light-headedness.  Dizziness, or passing out.  Tiredness (fatigue). Sometimes there are no symptoms. How is this diagnosed? This condition may be diagnosed based on:  Your medical history and symptoms.  An electrocardiogram (ECG). This imaging test measures the heart's electrical activity.  Blood tests. Cardiac blood tests may need to be repeated at designated time intervals.  Chest X-ray.  A CT scan of the chest.  A coronary angiogram. This is a procedure in which dye is injected into the bloodstream and then X-rays are taken to show if there is a blockage in a coronary artery.  Exercise stress testing.  Echocardiography. This is a test that uses sound waves to produce detailed images of the heart. How is this treated? The treatment is to restore blood flow to the heart as soon as possible. Treatment for this condition may include:  Oxygen therapy.  Medicines, such as: ? Antiplatelet medicines and blood-thinning medicines, such as aspirin. These help prevent blood clots. ? Medicine that dissolves any blood clots (fibrinolytic therapy). ? Blood pressure medicines. ? Nitroglycerin. This helps relieve chest pain and widens blood vessels to improve blood flow. ? Pain medicine. ? Cholesterol-lowering medicine.  Surgery, such as: ? Coronary angioplasty with stent placement. This involves placing a small piece of metal that looks like mesh or a spring into a narrow coronary artery. This widens the artery and keep it open. ? Coronary artery bypass surgery. This involves taking a section of a blood vessel from a different part of your body, and placing it on the blocked coronary artery to allow  blood to flow around (bypass) the blockage.  Cardiac rehabilitation. This is a program that helps improve your health and well-being. It includes exercise training, education, and counseling to help you recover. Follow  these instructions at home: Eating and drinking  Eat a heart-healthy diet that includes whole grains, fruits and vegetables, lean proteins, and low-fat or nonfat dairy products.  Limit how much salt (sodium) you eat as told by your health care provider. Follow instructions from your health care provider about any other eating or drinking restrictions, such as limiting foods that are high in fat and processed sugars.  Use healthy cooking methods such as roasting, grilling, broiling, baking, poaching, steaming, or stir-frying.  Talk with a dietitian to learn about healthy cooking methods and how to eat less sodium. Medicines  Take over-the-counter and prescription medicines only as told by your health care provider.  Do not take these medicines unless your health care provider approves: ? Vitamin supplements that contain vitamin A or vitamin E. ? Nonsteroidal anti-inflammatory drugs (NSAIDs), such as ibuprofen, naproxen, or celecoxib. ? Hormone replacement therapy that contains estrogen. If you are taking blood thinners:  Talk with your health care provider before you take any medicines that contain aspirin or NSAIDs. These medicines increase your risk for dangerous bleeding.  Take your medicine exactly as told, at the same time every day.  Avoid activities that could cause injury or bruising, and follow instructions about how to prevent falls.  Wear a medical alert bracelet, and carry a card that lists what medicines you take. Activity  Join a cardiac rehabilitation program. An exercise plan will be developed for you.  Ask your health care provider: ? What activities and exercises are safe for you. ? If you should follow specific instructions about lifting, driving, or climbing stairs. Lifestyle  Do not use any products that contain nicotine or tobacco, such as cigarettes and e-cigarettes. If you need help quitting, ask your health care provider.  If your health care provider  says that alcohol is safe for you, limit your alcohol intake to no more than 1 drink a day. One drink equals 12 oz of beer, 5 oz of wine, or 1 oz of hard liquor.  Maintain a healthy weight. If you need to lose weight, work with your health care provider to do so safely. General instructions  Tell all the health care providers who care for you about your heart condition, including your dentist. This may affect the medicines or treatment you receive.  Manage any other health conditions you have, such as hypertension or diabetes. These conditions affect your heart.  Learn ways to manage stress.  Get screened for depression, and get mental health treatment if you need it. People with ACS are at higher risk for depression.  Keep your vaccinations up to date. Get the flu shot (influenza vaccine) every year.  If directed, monitor your blood pressure at home.  Keep all follow-up visits as told by your health care provider. This is important. Contact a health care provider if:  You feel overwhelmed or sad.  You have trouble doing your daily activities. Get help right away if:  You have pain in your chest, neck, arm, jaw, stomach, or back that recurs, and: ? It lasts for more than a few minutes. ? It is not relieved by taking the [facility] health care provider prescribed.  You have unexplained: ? Heavy sweating. ? Heartburn or indigestion. ? Nausea or vomiting. ? Shortness of breath. ? Difficulty breathing. ?  Fatigue. ? Nervousness or anxiety. ? Weakness. ? Diarrhea. ? Dark stools or blood in your stool.  You have sudden light-headedness or dizziness.  Your blood pressure is higher than 180/120.  You faint.  You have thoughts about hurting yourself. These symptoms may represent a serious problem that is an emergency. Do not wait to see if the symptoms will go away. Get medical help right away. Call your local emergency services (911 in the [place] Do not drive yourself to the  hospital. If you ever feel like you may hurt yourself or others, or have thoughts about taking your own life, get help right away. You can go to your nearest emergency department or call:  Emergency services (911 in the [place]  A suicide crisis helpline, such as the [facility] at 301 554 1181. This is open 24 hours a day. Summary  Acute coronary syndrome (ACS) is when there is not enough blood and oxygen being supplied to the heart. ACS can result in chest pain or a heart attack.  Acute coronary syndrome is a medical emergency. If you have any symptoms of this condition, get help right away.  Treatment includes medicines and procedures to open the blocked arteries and restore blood flow. This information is not intended to replace advice given to you by your health care provider. Make sure you discuss any questions you have with your health care provider. Document Released: 12/27/2004 Document Revised: 09/06/2016 Document Reviewed: 09/06/2016 Elsevier Interactive Patient Education  2019 [place] exertional activities until you follow with your heart doctor

## 2018-06-21 NOTE — Progress Notes (Signed)
Small tortous first diagnal 80%, with normal LAD/LC/RCA and LVEF.  Treat medically with asp/plavix/nitrates and asp. May go home with f/u Monday 10 am

## 2018-06-21 NOTE — Plan of Care (Signed)
Pt ready for discharge home.  Problem: Education: Goal: Understanding of cardiac disease, CV risk reduction, and recovery process will improve Outcome: Completed/Met Goal: Individualized Educational Video(s) Outcome: Completed/Met   Problem: Activity: Goal: Ability to tolerate increased activity will improve Outcome: Completed/Met   Problem: Cardiac: Goal: Ability to achieve and maintain adequate cardiovascular perfusion will improve Outcome: Completed/Met   Problem: Health Behavior/Discharge Planning: Goal: Ability to safely manage health-related needs after discharge will improve Outcome: Completed/Met   Problem: Education: Goal: Knowledge of General Education information will improve Description: Including pain rating scale, medication(s)/side effects and non-pharmacologic comfort measures Outcome: Completed/Met   Problem: Health Behavior/Discharge Planning: Goal: Ability to manage health-related needs will improve Outcome: Completed/Met   Problem: Clinical Measurements: Goal: Ability to maintain clinical measurements within normal limits will improve Outcome: Completed/Met Goal: Will remain free from infection Outcome: Completed/Met Goal: Diagnostic test results will improve Outcome: Completed/Met Goal: Respiratory complications will improve Outcome: Completed/Met Goal: Cardiovascular complication will be avoided Outcome: Completed/Met   Problem: Activity: Goal: Risk for activity intolerance will decrease Outcome: Completed/Met   Problem: Nutrition: Goal: Adequate nutrition will be maintained Outcome: Completed/Met   Problem: Coping: Goal: Level of anxiety will decrease Outcome: Completed/Met   Problem: Elimination: Goal: Will not experience complications related to bowel motility Outcome: Completed/Met Goal: Will not experience complications related to urinary retention Outcome: Completed/Met   Problem: Pain Managment: Goal: General experience of  comfort will improve Outcome: Completed/Met   Problem: Safety: Goal: Ability to remain free from injury will improve Outcome: Completed/Met   Problem: Skin Integrity: Goal: Risk for impaired skin integrity will decrease Outcome: Completed/Met

## 2018-06-21 NOTE — Progress Notes (Signed)
Cardiac Rehab Navigator/ Exercise Physiologist Note  "Heart Attack Bouncing Back" booklet given and reviewed with patient. Discussed the definition of CAD.  Discussed modifiable risk factors including controlling blood pressure, cholesterol, and blood sugar; following heart healthy diet; maintaining healthy weight; exercise; and smoking cessation, n/a. Patient reports that her daughter is a Marine scientist and has a BP cuff at home and will take her BP regularly. Patient is not Diabetic.   Discussed cardiac medications including rationale for taking, mechanisms of action, and side effects. Stressed the importance of taking medications as prescribed.  Discussed emergency plan for heart attack symptoms. Patient verbalized understanding of need to call 911 and not to drive himself to ER if having cardiac symptoms / chest pain.  Diet of low sodium, low fat, low cholesterol heart healthy diet discussed. Information on diet provided. Patient reports that she doesn't eat that much but eats all of the wrong foods. Patient was advised to start making small changes that she can stick with rather than changing everything all at once for better success.  Smoking Cessation - Patient is a FORMER every day smoker.   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   Exercise - Benefits of exercised discussed. Patient reports being active. Patient mows her yard. Patient does not exercise. This EP informed patient her cardiologist has referred her to outpatient Cardiac Rehab. An overview of the program was provided. Brochure, informational letter, class and orientation times, and CPT billing codes given to patient. Patient is interested in participating. Patient plans to check with his insurance company to see what his out-of-pocket expenses will be. Patient informed the Cardiac Rehab department is currently closed due to the COVID-19 pandemic. The Cardiac Rehab dept will contact patient as soon as the  department reopens. Patient was given information about the Virtual Rehab that is taking place right now at no cost to the patient. Patient is interested. Patient wants to discuss with her daughter and needs to use her daughter's tablet and email for access. Patient was given the Dept. email and phone number to gain access to Virtual rehab if she decides to participate. Patient will be contacted in 1-2 weeks if she has not reached out to the Dept.  Patient appreciative of the information.  Jasper Loser, [facility]  Exercise Physiologist Department Phone #: [phone] Fax: [phone]  Direct Line [phone] Email Address: [email or web address]

## 2018-06-21 NOTE — Progress Notes (Signed)
MD Marcille Blanco made aware that pt keeps bleeding from right arm, dressing reinforced. No new orders given, staff will continue to assess.

## 2018-06-22 LAB — HIV ANTIBODY (ROUTINE TESTING W REFLEX): HIV Screen 4th Generation wRfx: NONREACTIVE

## 2018-06-22 NOTE — Discharge Summary (Signed)
[facility] - [place] [facility]   PATIENT NAME: Megan Rosales    MR#:  [id number]  DATE OF BIRTH:  25-Sep-1950  DATE OF ADMISSION:  06/19/2018 ADMITTING PHYSICIAN: Hannah BeatJan A Mansy, MD  DATE OF DISCHARGE: 06/21/2018  6:02 PM  PRIMARY CARE PHYSICIAN: Patient, No Pcp Per   ADMISSION DIAGNOSIS:  NSTEMI (non-ST elevated myocardial infarction) (HCC) [I21.4] Chest pain, unspecified type [R07.9]  DISCHARGE DIAGNOSIS:  Active Problems:   NSTEMI (non-ST elevated myocardial infarction) (HCC)   SECONDARY DIAGNOSIS:   Past Medical History:  Diagnosis Date  . Depression      ADMITTING HISTORY  HISTORY OF PRESENT ILLNESS:  Megan Rosales  is a 68 y.o. African-American female with a known history of depression, who presented to the emergency room with an onset of midsternal chest pain felt as somebody laying on her chest, severe in intensity that woke her up this morning.  She went to work however and it has been slightly increasing at work.  She continued and after going home it became much worse before she came to the ER.  She stated that she ate pork chops a couple of nights ago and since then has been having nausea and vomiting.  She denied any diaphoresis with her chest pain, dyspnea or palpitations.  No recent cough or wheezing or fever or chills.  No recent sick exposures.  No dysuria, oliguria or hematuria or flank pain.  No other bleeding diathesis.  Upon presentation to the emergency room, blood pressure was 152/91 with otherwise normal vital signs.  Labs were remarkable for a troponin of 0.85 and her CMP and CBC were unremarkable.  EKG showed normal sinus rhythm with a rate of 70 with T wave inversion in V1 and flattening of T waves in V2.  Repeat EKG about an hour later showed sinus rhythm with a rate of 87 with no changes.  Chest CTA showed no PE.  Lungs were clear.  There was an enlarged multinodular thyroid with recommendation for outpatient ultrasound.  Dr. Welton FlakesKhan was  notified about the patient.  She was given 4 baby aspirin and 75 mg subtenons Lovenox as well as 180 mg of p.o. Brilinta and sublingual nitroglycerin.  She was pain-free during my interview.  She will be admitted to a telemetry bed for further evaluation and management.   HOSPITAL COURSE:   *Non-ST elevation MI Patient was admitted to telemetry floor started on heparin, aspirin, beta-blocker and statin.  Patient had cardiac catheterization with Dr. Welton FlakesKhan.  It showed us occlusion in a tortuous diagonal vessel.  With no significant involvement of myocardium cardiology decided to treat this medically.  Patient was started on aspirin, Plavix, metoprolol, lisinopril, statin and discharged home.  No chest pain at time of discharge and ambulated well.  *Hypertension.  Started on metoprolol and lisinopril along with Imdur.  Cardiac rehab referral done.  Patient discharged home in stable condition to follow-up with cardiology and primary care physician.  CONSULTS OBTAINED:  Treatment Team:  Laurier NancyKhan, Shaukat A, MD  DRUG ALLERGIES:   Allergies  Allergen Reactions  . Penicillins     Makes genitals burn    DISCHARGE MEDICATIONS:   Allergies as of 06/21/2018      Reactions   Penicillins    Makes genitals burn      Medication List    TAKE these medications   aspirin 81 MG EC tablet Take 1 tablet (81 mg total) by mouth daily.   atorvastatin 40 MG tablet Commonly  known as: LIPITOR Take 1 tablet (40 mg total) by mouth daily at 8 pm.   clopidogrel 75 MG tablet Commonly known as: PLAVIX Take 1 tablet (75 mg total) by mouth daily.   isosorbide mononitrate 30 MG 24 hr tablet Commonly known as: IMDUR Take 0.5 tablets (15 mg total) by mouth daily.   lisinopril 2.5 MG tablet Commonly known as: Zestril Take 1 tablet (2.5 mg total) by mouth daily for 30 days.   metoprolol tartrate 25 MG tablet Commonly known as: LOPRESSOR Take 1 tablet (25 mg total) by mouth 2 (two) times daily.        Today   VITAL SIGNS:  Blood pressure (!) 100/58, pulse 83, temperature 98.3 F (36.8 C), temperature source Oral, resp. rate 17, height 5\' 1"  (1.549 m), weight 76.8 kg, SpO2 99 %.  I/O:    Intake/Output Summary (Last 24 hours) at 06/22/2018 1323 Last data filed at 06/21/2018 1619 Gross per 24 hour  Intake -  Output 100 ml  Net -100 ml    PHYSICAL EXAMINATION:  Physical Exam  GENERAL:  68 y.o.-year-old patient lying in the bed with no acute distress.  LUNGS: Normal breath sounds bilaterally, no wheezing, rales,rhonchi or crepitation. No use of accessory muscles of respiration.  CARDIOVASCULAR: S1, S2 normal. No murmurs, rubs, or gallops.  ABDOMEN: Soft, non-tender, non-distended. Bowel sounds present. No organomegaly or mass.  NEUROLOGIC: Moves all 4 extremities. PSYCHIATRIC: The patient is alert and oriented x 3.  SKIN: No obvious rash, lesion, or ulcer.   DATA REVIEW:   CBC Recent Labs  Lab 06/20/18 0646  WBC 9.7  HGB 13.3  HCT 40.3  PLT 216    Chemistries  Recent Labs  Lab 06/19/18 2104 06/20/18 0646  NA 141 141  K 3.6 4.2  CL 109 109  CO2 23 25  GLUCOSE 106* 98  BUN 13 9  CREATININE 0.76 0.78  CALCIUM 9.2 9.3  AST 40  --   ALT 33  --   ALKPHOS 118  --   BILITOT 0.5  --     Cardiac Enzymes Recent Labs  Lab 06/20/18 2210  TROPONINI 5.54*    Microbiology Results  Results for orders placed or performed during the hospital encounter of 06/19/18  Novel Coronavirus,NAA,(SEND-OUT TO REF LAB - TAT 24-48 hrs); Hosp Order     Status: None   Collection Time: 06/19/18 10:05 PM   Specimen: Nasopharyngeal Swab; Respiratory  Result Value Ref Range Status   SARS-CoV-2, NAA NOT DETECTED NOT DETECTED Final    Comment: (NOTE) This test was developed and its performance characteristics determined by [facility]. This test has not been FDA cleared or approved. This test has been authorized by FDA under an Emergency Use Authorization (EUA). This  test is only authorized for the duration of time the declaration that circumstances exist justifying the authorization of the emergency use of in vitro diagnostic tests for detection of SARS-CoV-2 virus and/or diagnosis of COVID-19 infection under section 564(b)(1) of the Act, 21 U.S.C. 161WRU-0(A)(5360bbb-3(b)(1), unless the authorization is terminated or revoked sooner. When diagnostic testing is negative, the possibility of a false negative result should be considered in the context of a patient's recent exposures and the presence of clinical signs and symptoms consistent with COVID-19. An individual without symptoms of COVID-19 and who is not shedding SARS-CoV-2 virus would expect to have a negative (not detected) result in this assay. Performed  At: [facility] [address] [place], [place] [id number] Jolene SchimkeNagendra Sanjai  MD UK:[id number]    Coronavirus Source NASOPHARYNGEAL  Final    Comment: Performed at [facility], [address] [place], [place] [address]    RADIOLOGY:  No results found.  Follow up with PCP in 1 week.  Management plans discussed with the patient, family and they are in agreement.  CODE STATUS:  Code Status History    Date Active Date Inactive Code Status Order ID Comments User Context   06/19/2018 2355 06/21/2018 2103 Full Code [id number]  Mansy, Arvella Merles, MD ED   Advance Care Planning Activity      TOTAL TIME TAKING CARE OF THIS PATIENT ON DAY OF DISCHARGE: more than 30 minutes.   Neita Carp M.D on 06/22/2018 at 1:23 PM  Between 7am to 6pm - Pager - [phone]  After 6pm go to [email or web address] - password [email or web address] [facility]  Office  [phone]  CC: Primary care physician; Patient, No Pcp Per  Note: This dictation was prepared with Dragon dictation along with smaller phrase technology. Any transcriptional errors that result from this process are unintentional.

## 2018-07-25 ENCOUNTER — Other Ambulatory Visit: Payer: Self-pay

## 2018-07-25 DIAGNOSIS — Z20822 Contact with and (suspected) exposure to covid-19: Secondary | ICD-10-CM

## 2018-07-29 LAB — NOVEL CORONAVIRUS, NAA: SARS-CoV-2, NAA: NOT DETECTED

## 2018-08-10 ENCOUNTER — Other Ambulatory Visit: Payer: Self-pay | Admitting: Family

## 2018-08-10 DIAGNOSIS — Z1231 Encounter for screening mammogram for malignant neoplasm of breast: Secondary | ICD-10-CM

## 2018-09-14 ENCOUNTER — Other Ambulatory Visit: Payer: Self-pay | Admitting: Gastroenterology

## 2018-09-14 DIAGNOSIS — R1319 Other dysphagia: Secondary | ICD-10-CM

## 2018-09-14 DIAGNOSIS — R1310 Dysphagia, unspecified: Secondary | ICD-10-CM

## 2018-09-20 ENCOUNTER — Ambulatory Visit
Admission: RE | Admit: 2018-09-20 | Discharge: 2018-09-20 | Disposition: A | Discharge status: Home / Self Care | Payer: No Typology Code available for payment source | Source: Ambulatory Visit | Attending: Gastroenterology | Admitting: Gastroenterology

## 2018-09-20 ENCOUNTER — Other Ambulatory Visit: Payer: Self-pay | Admitting: Gastroenterology

## 2018-09-20 ENCOUNTER — Other Ambulatory Visit: Payer: Self-pay

## 2018-09-20 DIAGNOSIS — R1319 Other dysphagia: Secondary | ICD-10-CM

## 2018-09-20 DIAGNOSIS — R1310 Dysphagia, unspecified: Secondary | ICD-10-CM | POA: Insufficient documentation

## 2019-01-21 ENCOUNTER — Other Ambulatory Visit: Admission: RE | Admit: 2019-01-21 | Payer: PRIVATE HEALTH INSURANCE | Source: Ambulatory Visit

## 2019-01-23 ENCOUNTER — Encounter: Admission: RE | Payer: Self-pay | Source: Home / Self Care

## 2019-01-23 ENCOUNTER — Ambulatory Visit
Admission: RE | Admit: 2019-01-23 | Payer: PRIVATE HEALTH INSURANCE | Source: Home / Self Care | Admitting: Internal Medicine

## 2019-01-23 SURGERY — COLONOSCOPY WITH PROPOFOL
Anesthesia: General

## 2020-07-25 IMAGING — CT CT ANGIOGRAPHY CHEST
4 of 7 series · 18 of 36 positions shown · IV contrast (APPLIED)
Comparison: None.

CLINICAL DATA: Concern for aortic dissection.  Chest pain.

EXAM:
CT ANGIOGRAPHY CHEST WITH CONTRAST
TECHNIQUE: Multidetector CT imaging of the chest was performed using the
standard protocol during bolus administration of intravenous
contrast. Multiplanar CT image reconstructions and MIPs were
obtained to evaluate the vascular anatomy.
CONTRAST:  100mL PFNT01-S6I IOPAMIDOL (PFNT01-S6I) INJECTION 76%

[Series 4: axial pre · axial · non-contrast · 0.80mm/px · z∈[+314,+444]mm · 3 of 54 slices shown]
[im 14/54  lung]
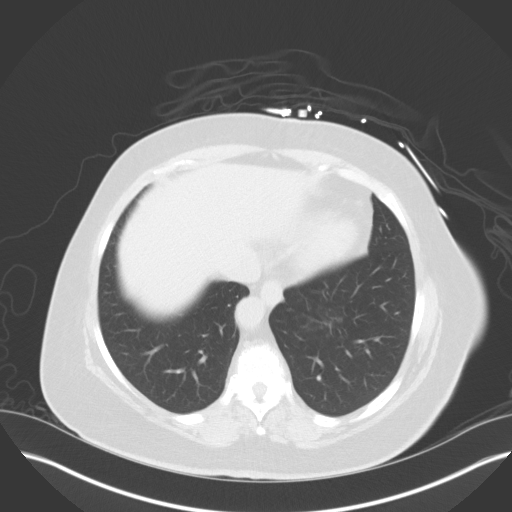
[im 27/54  lung]
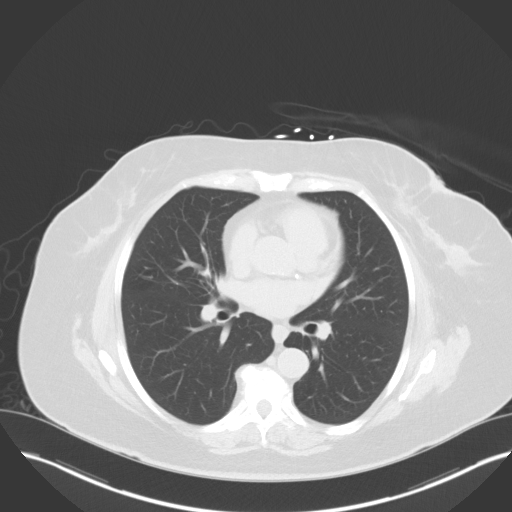
[im 40/54  lung]
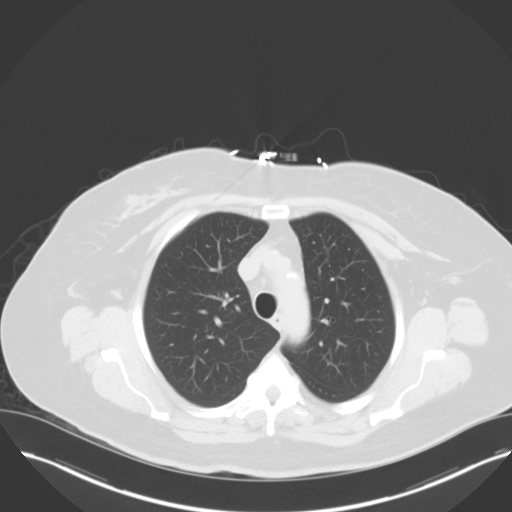

[Series 5: axial arterial · axial · arterial · 0.80mm/px · z∈[+274,+478]mm · 7 of 92 slices shown]
[im 12/92  lung]
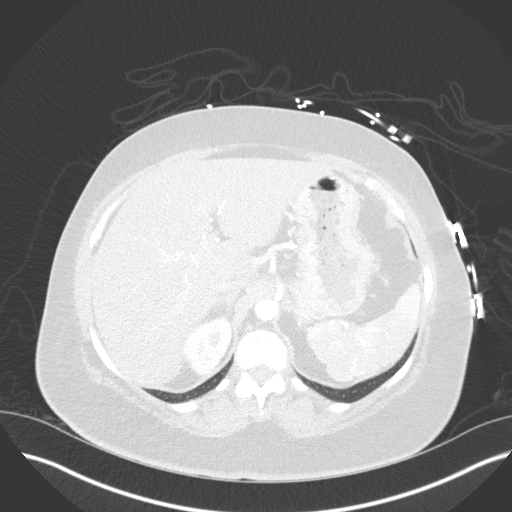
[im 23/92  mediastinal]
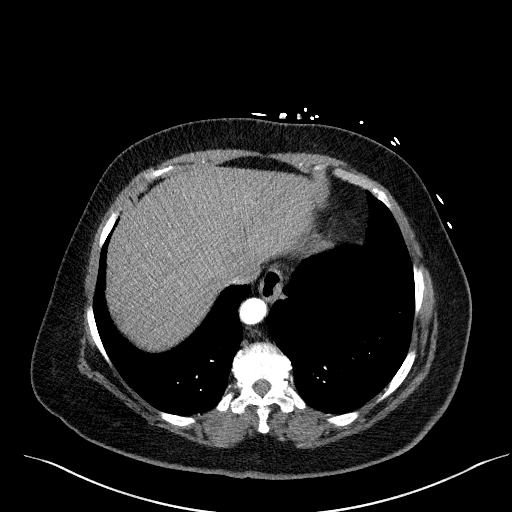
[im 35/92  lung]
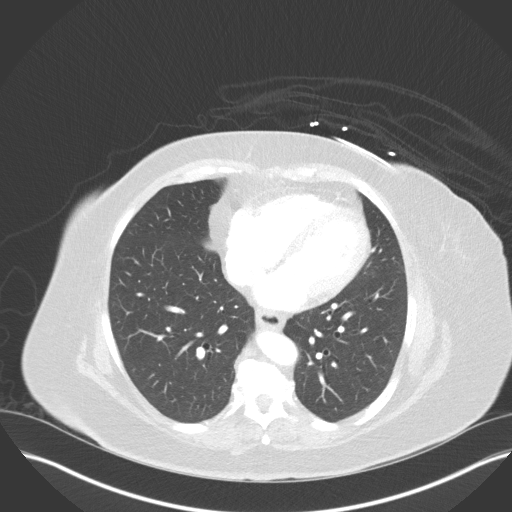
[im 46/92  mediastinal]
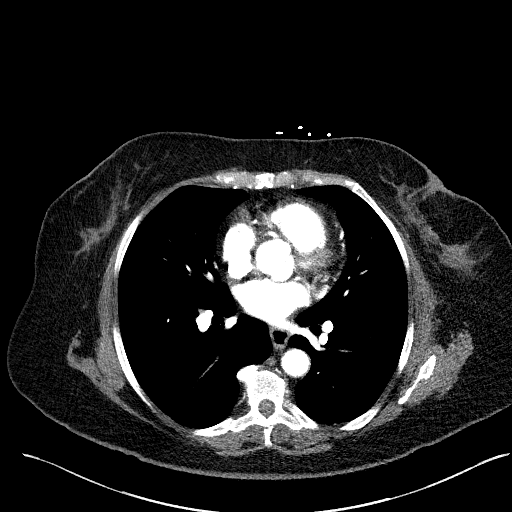
[im 57/92  lung]
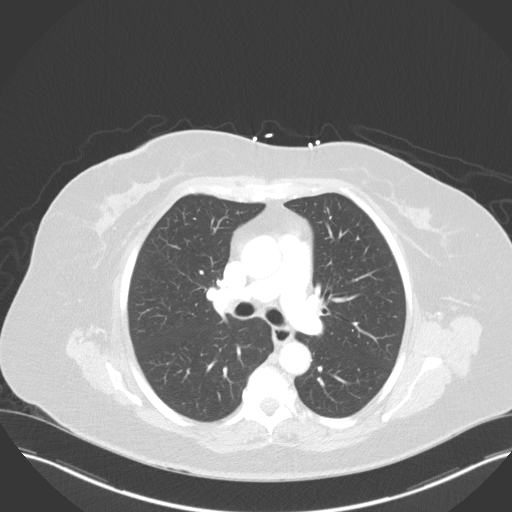
[im 69/92  mediastinal]
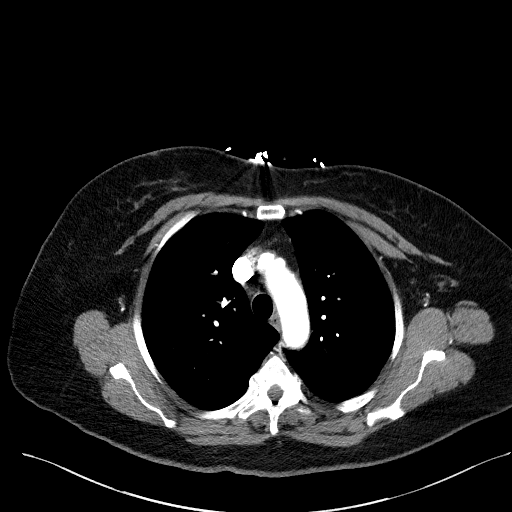
[im 80/92  lung]
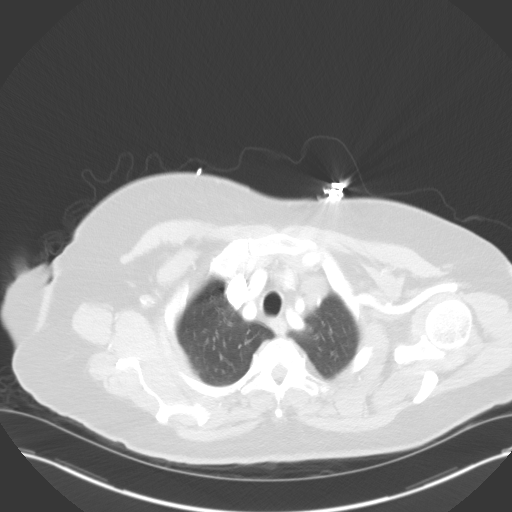

[Series 6: lung · axial · 0.80mm/px · z∈[+260,+450]mm · 7 of 138 slices shown]
[im 11/138  mediastinal]
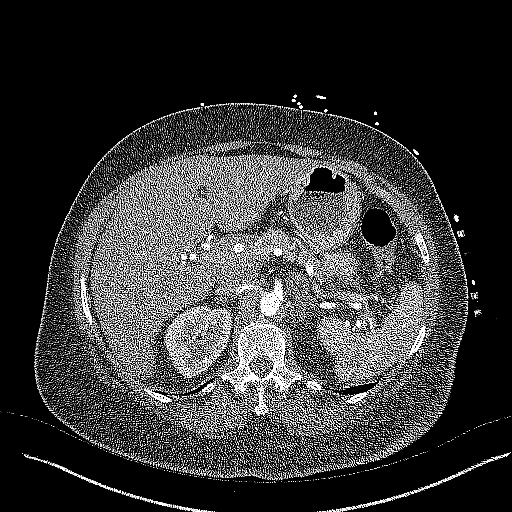
[im 32/138  mediastinal]
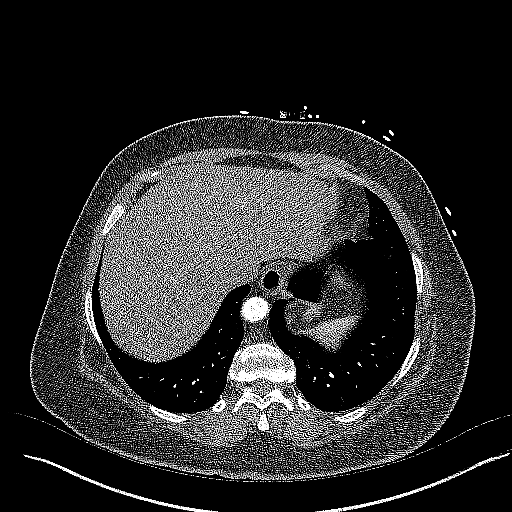
[im 43/138  mediastinal]
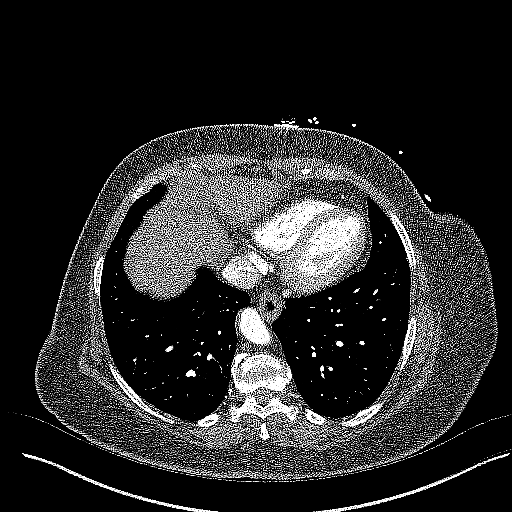
[im 64/138  mediastinal]
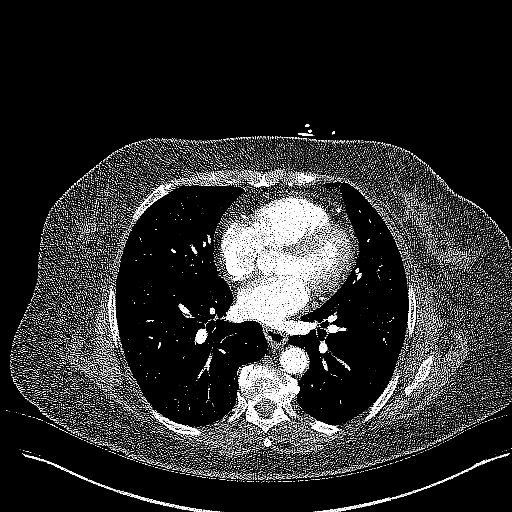
[im 74/138  mediastinal]
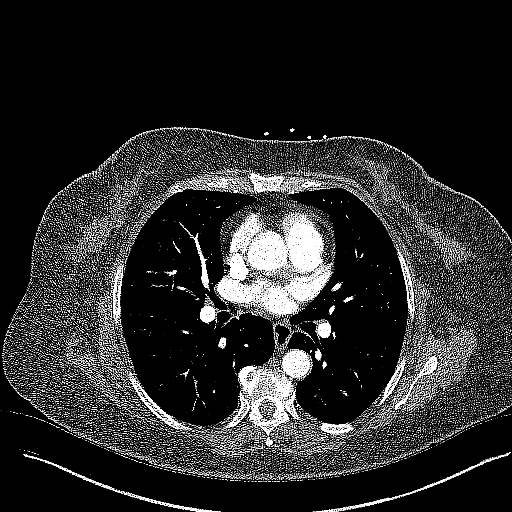
[im 95/138  mediastinal]
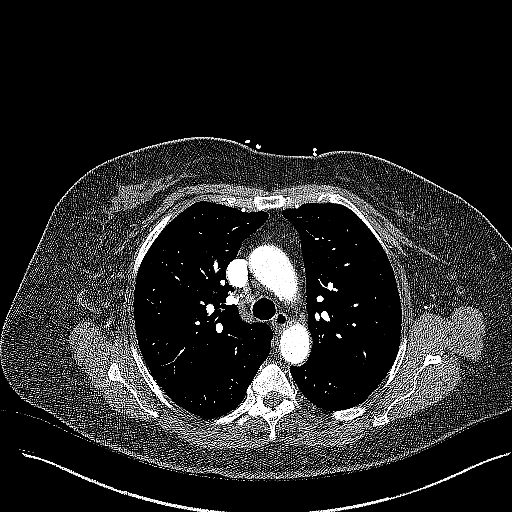
[im 106/138  mediastinal]
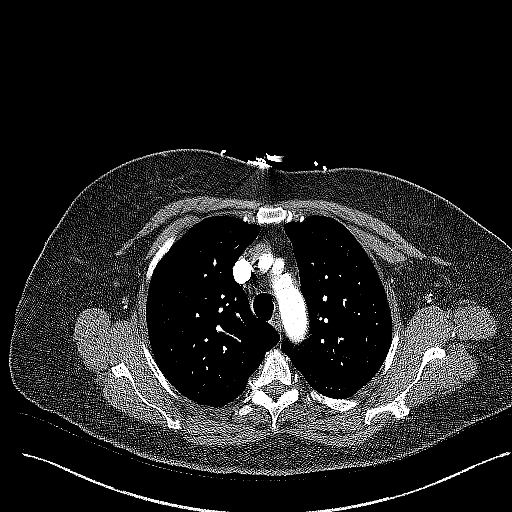

[Series 7: coronals · coronal · 0.57mm/px · 1 of 134 slices shown]
[im 67/134  mediastinal]
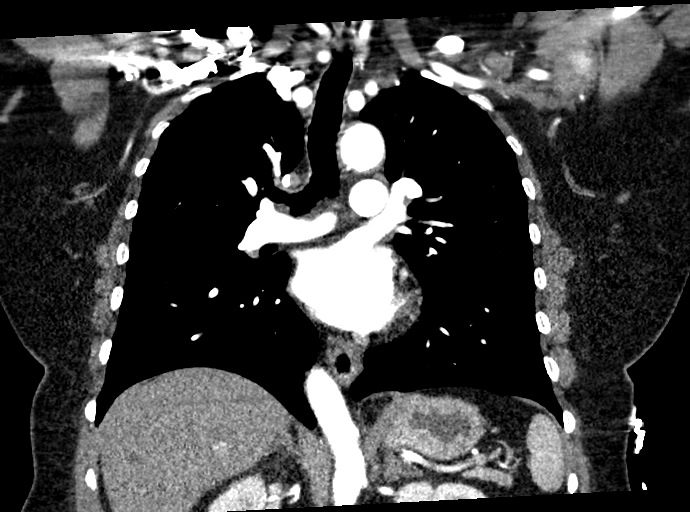

[18 of 36 positions shown; findings below may reference images not displayed]

FINDINGS: Cardiovascular: There mild aortic calcifications. There is no PE.
The heart size is normal. The main pulmonary artery is not
significantly dilated. There is no evidence of a thoracic aortic
aneurysm. The thoracic aorta is somewhat tortuous. There is no
significant pericardial effusion.

Mediastinum/Nodes: There are no pathologically enlarged mediastinal
or hilar lymph nodes. No pathologically enlarged axillary lymph
nodes. The thyroid gland is diffusely heterogeneous with multiple
thyroid nodules.

Lungs/Pleura: Lungs are clear. No pleural effusion or pneumothorax.

Upper Abdomen: There is a lipid rich benign adrenal adenoma
involving the left adrenal gland measuring approximately 1.8 cm. The
right adrenal gland is unremarkable. The remaining portions of the
upper abdomen are unremarkable.

Musculoskeletal: No chest wall abnormality. No acute or significant
osseous findings.

Review of the MIP images confirms the above findings.
IMPRESSION: 1. No dissection.  No PE.
2. The lungs are clear.
3. Enlarged multinodular thyroid gland. Follow-up with nonemergent
outpatient thyroid ultrasound is recommended for further evaluation.

Aortic Atherosclerosis ([id number].T).

## 2024-01-30 ENCOUNTER — Other Ambulatory Visit: Payer: Self-pay | Admitting: Orthopedic Surgery

## 2024-02-13 ENCOUNTER — Encounter: Payer: Self-pay | Admitting: Orthopedic Surgery

## 2024-02-15 ENCOUNTER — Encounter: Payer: Self-pay | Admitting: Orthopedic Surgery
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
## 2024-02-16 ENCOUNTER — Ambulatory Visit
Admission: RE | Admit: 2024-02-16 | Discharge: 2024-02-16 | Disposition: A | Payer: PRIVATE HEALTH INSURANCE | Attending: Orthopedic Surgery | Admitting: Orthopedic Surgery

## 2024-02-16 ENCOUNTER — Encounter: Payer: Self-pay | Admitting: Orthopedic Surgery

## 2024-02-16 ENCOUNTER — Encounter: Admission: RE | Disposition: A | Payer: Self-pay | Source: Home / Self Care | Attending: Orthopedic Surgery

## 2024-02-16 ENCOUNTER — Encounter: Admitting: Anesthesiology

## 2024-02-16 ENCOUNTER — Other Ambulatory Visit: Payer: Self-pay

## 2024-02-16 HISTORY — DX: Gastro-esophageal reflux disease without esophagitis: K21.9

## 2024-02-16 HISTORY — DX: Unspecified osteoarthritis, unspecified site: M19.90

## 2024-02-16 HISTORY — DX: Acute myocardial infarction, unspecified: I21.9

## 2024-02-16 MED ORDER — OXYCODONE HCL 5 MG/5ML PO SOLN: 5.0000 mg | Freq: Once | ORAL | Status: DC | PRN

## 2024-02-16 MED ORDER — EPHEDRINE SULFATE (PRESSORS) 25 MG/5ML IV SOSY
PREFILLED_SYRINGE | INTRAVENOUS | Status: DC | PRN
  Administered 2024-02-16 (×3): 5 mg via INTRAVENOUS
  Administered 2024-02-16: 10 mg via INTRAVENOUS

## 2024-02-16 MED ORDER — LACTATED RINGERS IR SOLN
Status: DC | PRN
  Administered 2024-02-16: 6000 mL
  Administered 2024-02-16: 12000 mL
  Administered 2024-02-16: 6000 mL

## 2024-02-16 MED ORDER — FENTANYL CITRATE (PF) 50 MCG/ML IJ SOSY: 25.0000 ug | PREFILLED_SYRINGE | INTRAMUSCULAR | Status: DC | PRN

## 2024-02-16 MED ORDER — PROPOFOL 10 MG/ML IV BOLUS
INTRAVENOUS | Status: AC
  Filled 2024-02-16: qty 20

## 2024-02-16 MED ORDER — OXYCODONE HCL 5 MG PO TABS: 5.0000 mg | ORAL_TABLET | ORAL | 0 refills | Status: AC | PRN

## 2024-02-16 MED ORDER — SUCCINYLCHOLINE CHLORIDE 200 MG/10ML IV SOSY
PREFILLED_SYRINGE | INTRAVENOUS | Status: AC
  Filled 2024-02-16: qty 10

## 2024-02-16 MED ORDER — OXYCODONE HCL 5 MG PO TABS: 5.0000 mg | ORAL_TABLET | Freq: Once | ORAL | Status: DC | PRN

## 2024-02-16 MED ORDER — LIDOCAINE HCL (CARDIAC) PF 100 MG/5ML IV SOSY
PREFILLED_SYRINGE | INTRAVENOUS | Status: DC | PRN
  Administered 2024-02-16: 100 mg via INTRAVENOUS

## 2024-02-16 MED ORDER — ACETAMINOPHEN 500 MG PO TABS: 1000.0000 mg | ORAL_TABLET | Freq: Three times a day (TID) | ORAL | 2 refills | Status: AC

## 2024-02-16 MED ORDER — FENTANYL CITRATE (PF) 100 MCG/2ML IJ SOLN
INTRAMUSCULAR | Status: AC
  Filled 2024-02-16: qty 2

## 2024-02-16 MED ORDER — PROPOFOL 10 MG/ML IV BOLUS
INTRAVENOUS | Status: DC | PRN
  Administered 2024-02-16: 200 mg via INTRAVENOUS

## 2024-02-16 MED ORDER — IBUPROFEN 200 MG PO TABS: 200.0000 mg | ORAL_TABLET | Freq: Four times a day (QID) | ORAL | Status: DC | PRN

## 2024-02-16 MED ORDER — ONDANSETRON HCL 4 MG/2ML IJ SOLN
INTRAMUSCULAR | Status: DC | PRN
  Administered 2024-02-16: 4 mg via INTRAVENOUS

## 2024-02-16 MED ORDER — PHENYLEPHRINE HCL (PRESSORS) 10 MG/ML IV SOLN
INTRAVENOUS | Status: DC | PRN
  Administered 2024-02-16 (×2): 160 ug via INTRAVENOUS
  Administered 2024-02-16: 240 ug via INTRAVENOUS
  Administered 2024-02-16: 80 ug via INTRAVENOUS
  Administered 2024-02-16: 160 ug via INTRAVENOUS

## 2024-02-16 MED ORDER — LIDOCAINE HCL (PF) 2 % IJ SOLN
INTRAMUSCULAR | Status: AC
  Filled 2024-02-16: qty 5

## 2024-02-16 MED ORDER — ONDANSETRON 4 MG PO TBDP: 4.0000 mg | ORAL_TABLET | Freq: Three times a day (TID) | ORAL | 0 refills | Status: AC | PRN

## 2024-02-16 MED ORDER — CEFAZOLIN SODIUM-DEXTROSE 2-3 GM-%(50ML) IV SOLR
INTRAVENOUS | Status: AC
  Filled 2024-02-16: qty 50

## 2024-02-16 MED ORDER — SODIUM CHLORIDE 0.9 % IV SOLN: INTRAVENOUS | Status: DC

## 2024-02-16 MED ORDER — FENTANYL CITRATE (PF) 100 MCG/2ML IJ SOLN
INTRAMUSCULAR | Status: DC | PRN
  Administered 2024-02-16: 100 ug via INTRAVENOUS
  Administered 2024-02-16 (×3): 50 ug via INTRAVENOUS

## 2024-02-16 MED ORDER — ROCURONIUM BROMIDE 10 MG/ML (PF) SYRINGE
PREFILLED_SYRINGE | INTRAVENOUS | Status: AC
  Filled 2024-02-16: qty 10

## 2024-02-16 MED ORDER — DEXAMETHASONE SODIUM PHOSPHATE 4 MG/ML IJ SOLN
INTRAMUSCULAR | Status: AC
  Filled 2024-02-16: qty 1

## 2024-02-16 MED ORDER — MIDAZOLAM HCL 2 MG/2ML IJ SOLN
INTRAMUSCULAR | Status: AC
  Filled 2024-02-16: qty 2

## 2024-02-16 MED ORDER — LACTATED RINGERS IV SOLN
INTRAVENOUS | Status: DC | PRN
  Administered 2024-02-16: 4 mL

## 2024-02-16 MED ORDER — ROCURONIUM BROMIDE 100 MG/10ML IV SOLN
INTRAVENOUS | Status: DC | PRN
  Administered 2024-02-16: 5 mg via INTRAVENOUS

## 2024-02-16 MED ORDER — SUCCINYLCHOLINE CHLORIDE 200 MG/10ML IV SOSY
PREFILLED_SYRINGE | INTRAVENOUS | Status: DC | PRN
  Administered 2024-02-16: 100 mg via INTRAVENOUS

## 2024-02-16 MED ORDER — ASPIRIN 325 MG PO TBEC: 325.0000 mg | DELAYED_RELEASE_TABLET | Freq: Every day | ORAL | 0 refills | Status: AC

## 2024-02-16 MED ORDER — LACTATED RINGERS IV SOLN: INTRAVENOUS | Status: DC

## 2024-02-16 MED ORDER — CEFAZOLIN SODIUM-DEXTROSE 2-4 GM/100ML-% IV SOLN
2.0000 g | INTRAVENOUS | Status: AC
  Administered 2024-02-16: 2 g via INTRAVENOUS

## 2024-02-16 MED ORDER — BUPIVACAINE LIPOSOME 1.3 % IJ SUSP
INTRAMUSCULAR | Status: DC | PRN
  Administered 2024-02-16: 15 mL

## 2024-02-16 MED ORDER — BUPIVACAINE LIPOSOME 1.3 % IJ SUSP
INTRAMUSCULAR | Status: AC
  Filled 2024-02-16: qty 20

## 2024-02-16 MED ORDER — DEXAMETHASONE SODIUM PHOSPHATE 4 MG/ML IJ SOLN
INTRAMUSCULAR | Status: DC | PRN
  Administered 2024-02-16: 4 mg via INTRAVENOUS

## 2024-02-16 MED ORDER — BUPIVACAINE HCL (PF) 0.5 % IJ SOLN
INTRAMUSCULAR | Status: DC | PRN
  Administered 2024-02-16: 3 mL

## 2024-02-16 MED ORDER — IBUPROFEN 100 MG/5ML PO SUSP: 200.0000 mg | Freq: Four times a day (QID) | ORAL | Status: DC | PRN

## 2024-02-16 MED ORDER — ONDANSETRON HCL 4 MG/2ML IJ SOLN
INTRAMUSCULAR | Status: AC
  Filled 2024-02-16: qty 2

## 2024-02-16 NOTE — Anesthesia Procedure Notes (Signed)
 Procedure Name: Intubation Date/Time: 02/16/2024 7:44 AM  Performed by: Veronica Alm BROCKS, CRNAPre-anesthesia Checklist: Patient identified, Emergency Drugs available, Suction available and Patient being monitored Patient Re-evaluated:Patient Re-evaluated prior to induction Oxygen Delivery Method: Circle system utilized Preoxygenation: Pre-oxygenation with 100% oxygen Induction Type: IV induction Ventilation: Mask ventilation without difficulty Laryngoscope Size: McGrath and 3 Tube type: Oral Tube size: 7.0 mm Number of attempts: 1 Airway Equipment and Method: Stylet and Oral airway Placement Confirmation: ETT inserted through vocal cords under direct vision, positive ETCO2 and breath sounds checked- equal and bilateral Secured at: 21 cm Tube secured with: Tape Dental Injury: Teeth and Oropharynx as per pre-operative assessment

## 2024-02-16 NOTE — Transfer of Care (Signed)
 Immediate Anesthesia Transfer of Care Note  Patient: Megan Rosales  Procedure(s) Performed: ARTHROSCOPY, SHOULDER, WITH ROTATOR CUFF REPAIR (Right) DECOMPRESSION, SUBACROMIAL SPACE (Right) TENODESIS, BICEPS (Right)  Patient Location: PACU  Anesthesia Type: General, Regional  Level of Consciousness: sedated  Airway and Oxygen Therapy: Patient Spontanous Breathing and Patient connected to supplemental oxygen  Post-op Assessment: Post-op Vital signs reviewed, Patient's Cardiovascular Status Stable, Respiratory Function Stable, Patent Airway and No signs of Nausea or vomiting  Post-op Vital Signs: Reviewed and stable  Complications: No notable events documented.

## 2024-02-16 NOTE — Anesthesia Procedure Notes (Signed)
 Anesthesia Regional Block: Interscalene brachial plexus block   Pre-Anesthetic Checklist: , timeout performed,  Correct Patient, Correct Site, Correct Laterality,  Correct Procedure, Correct Position, site marked,  Risks and benefits discussed,  Surgical consent,  Pre-op evaluation,  At surgeon's request and post-op pain management  Laterality: Right and Upper  Prep: chloraprep       Needles:  Injection technique: Single-shot  Needle Type: Stimiplex     Needle Length: 10cm  Needle Gauge: 22     Additional Needles:   Procedures:,,,, ultrasound used (permanent image in chart),,     Nerve Stimulator or Paresthesia:  Response: biceps flexion  Additional Responses:   Narrative:  Start time: 02/16/2024 6:50 AM End time: 02/16/2024 6:58 AM Injection made incrementally with aspirations every 5 mL.  Performed by: Personally   Additional Notes: Functioning IV was confirmed and monitors were applied.  A 50mm 22ga Stimuplex needle was used. Sterile prep and drape,hand hygiene and sterile gloves were used.  Negative aspiration and negative test dose prior to incremental administration of local anesthetic. The patient tolerated the procedure well.

## 2024-02-16 NOTE — Discharge Instructions (Addendum)
Post-Op Instructions - Rotator Cuff Repair  1. Bracing: You will wear a shoulder immobilizer or sling for 6 weeks.   2. Driving: No driving for 3 weeks post-op. When driving, do not wear the immobilizer. Ideally, we recommend no driving for 6 weeks while sling is in place as one arm will be immobilized.   3. Activity: No active lifting for 2 months. Wrist, hand, and elbow motion only. Avoid lifting the upper arm away from the body except for hygiene. You are permitted to bend and straighten the elbow passively only (no active elbow motion). You may use your hand and wrist for typing, writing, and managing utensils (cutting food). Do not lift more than a coffee cup for 8 weeks.  When sleeping or resting, inclined positions (recliner chair or wedge pillow) and a pillow under the forearm for support may provide better comfort for up to 4 weeks.  Avoid long distance travel for 4 weeks.  Return to normal activities after rotator cuff repair repair normally takes 6 months on average. If rehab goes very well, may be able to do most activities at 4 months, except overhead or contact sports.  4. Physical Therapy: Begins 3-4 days after surgery, and proceed 1 time per week for the first 6 weeks, then 1-2 times per week from weeks 6-20 post-op.  5. Medications:  - You will be provided a prescription for narcotic pain medicine. After surgery, take 1-2 narcotic tablets every 4 hours if needed for severe pain.  - A prescription for anti-nausea medication will be provided in case the narcotic medicine causes nausea - take 1 tablet every 6 hours only if nauseated.   - Take tylenol 1000 mg (2 Extra Strength tablets or 3 regular strength) every 8 hours for pain.  May decrease or stop tylenol 5 days after surgery if you are having minimal pain. - Take ASA 325mg /day x 2 weeks to help prevent DVTs/PEs (blood clots).  - DO NOT take ANY nonsteroidal anti-inflammatory pain medications (Advil, Motrin, Ibuprofen, Aleve,  Naproxen, or Naprosyn). These medicines can inhibit healing of your shoulder repair.    If you are taking prescription medication for anxiety, depression, insomnia, muscle spasm, chronic pain, or for attention deficit disorder, you are advised that you are at a higher risk of adverse effects with use of narcotics post-op, including narcotic addiction/dependence, depressed breathing, death. If you use non-prescribed substances: alcohol, marijuana, cocaine, heroin, methamphetamines, etc., you are at a higher risk of adverse effects with use of narcotics post-op, including narcotic addiction/dependence, depressed breathing, death. You are advised that taking > 50 morphine milligram equivalents (MME) of narcotic pain medication per day results in twice the risk of overdose or death. For your prescription provided: oxycodone 5 mg - taking more than 6 tablets per day would result in > 50 morphine milligram equivalents (MME) of narcotic pain medication. Be advised that we will prescribe narcotics short-term, for acute post-operative pain only - 3 weeks for major operations such as shoulder repair/reconstruction surgeries.     6. Post-Op Appointment:  Your first post-op appointment will be 10-14 days post-op.  7. Work or School: For most, but not all procedures, we advise staying out of work or school for at least 1 to 2 weeks in order to recover from the stress of surgery and to allow time for healing.   If you need a work or school note this can be provided.   8. Smoking: If you are a smoker, you need to refrain from  smoking in the postoperative period. The nicotine in cigarettes will inhibit healing of your shoulder repair and decrease the chance of successful repair. Similarly, nicotine containing products (gum, patches) should be avoided.   Post-operative Brace: Apply and remove the brace you received as you were instructed to at the time of fitting and as described in detail as the brace's  instructions for use indicate.  Wear the brace for the period of time prescribed by your physician.  The brace can be cleaned with soap and water and allowed to air dry only.  Should the brace result in increased pain, decreased feeling (numbness/tingling), increased swelling or an overall worsening of your medical condition, please contact your doctor immediately.  If an emergency situation occurs as a result of wearing the brace after normal business hours, please dial 911 and seek immediate medical attention.  Let your doctor know if you have any further questions about the brace issued to you. Refer to the shoulder sling instructions for use if you have any questions regarding the correct fit of your shoulder sling.  [facility] Customer Care for Troubleshooting: [phone]  Video that illustrates how to properly use a shoulder sling: "Instructions for Proper Use of an Orthopaedic Sling" http://bass.com/       PERIPHERAL NERVE BLOCK PATIENT INFORMATION  Your surgeon has requested a peripheral nerve block for your surgery. This anesthetic technique provides excellent post-operative pain relief for you in a safe and effective manner. It will also help reduce the risk of nausea and vomiting and allow earlier discharge from the hospital.   The block is performed under sedation with ultrasound guidance prior to your procedure. Due to the sedation, your may or may not remember the block experience. The nerve block will begin to take effect anywhere from 5 to 30 minutes after being administered. You will be transported to the operating room from your surgery after the block is completed.   At the end of surgery, when the anesthesia wears off, you will notice a few things. Your may not be able to move or feel the part of your body targeted by the nerve block. These are normal experiences, and they will disappear as the block wears off.  If you had an interscalene nerve block  performed (which is common for shoulder surgery), your voice can be very hoarse and you may feel that you are not able to take as deep a breath as you did before surgery. Some patients may also notice a droopy eyelid on the affected side. These symptoms will resolve once the block wears off.  Pain control: The nerve block technique used is a single injection that can last anywhere from 1-3 days. The duration of the numbness can vary between individuals. After leaving the hospital, it is important that you begin to take your prescribed pain medication when you start to sense the nerve block wearing off. This will help you avoid unpleasant pain at the time the nerve block wears off, which can sometimes be in the middle of the night. The block will only cover pain in the areas targeted by the nerve block so if you experience surgical pain outside of that area, please take your prescribed pain medication. Management of the "numb area": After a nerve block, you cannot feel pain, pressure, or temperature in the affected area so there is an increased risk for injury. You should take extra care to protect the affected areas until sensation and movement returns. Please take caution to not come  in contact with extremely hot or cold items because you will not be able to sense or protect yourself form the extremes of temperature.  You may experience some persistent numbness after the procedure by most neurological deficits resolve over time and the incidence of serious long term neurological complications attributable to peripheral nerve blocks are relatively uncommon.  POLAR CARE INFORMATION  MassAdvertisement.it  How to use [facility] Polar Care [facility] Therapy System?  YouTube   ShippingScam.co.uk  OPERATING INSTRUCTIONS  Start the product With dry hands, connect the transformer to the electrical connection located on the top of the cooler. Next, plug the transformer into an appropriate  electrical outlet. The unit will automatically start running at this point.  To stop the pump, disconnect electrical power.  Unplug to stop the product when not in use. Unplugging the Polar Care unit turns it off. Always unplug immediately after use. Never leave it plugged in while unattended. Remove pad.    FIRST ADD WATER TO FILL LINE, THEN ICE---Replace ice when existing ice is almost melted  1 Discuss Treatment with your Licensed Health Care Practitioner and Use Only as Prescribed 2 Apply Insulation Barrier & Cold Therapy Pad 3 Check for Moisture 4 Inspect Skin Regularly  Tips and Trouble Shooting Usage Tips 1. Use cubed or chunked ice for optimal performance. 2. It is recommended to drain the Pad between uses. To drain the pad, hold the Pad upright with the hose pointed toward the ground. Depress the black plunger and allow water to drain out. 3. You may disconnect the Pad from the unit without removing the pad from the affected area by depressing the silver tabs on the hose coupling and gently pulling the hoses apart. The Pad and unit will seal itself and will not leak. Note: Some dripping during release is normal. 4. DO NOT RUN PUMP WITHOUT WATER! The pump in this unit is designed to run with water. Running the unit without water will cause permanent damage to the pump. 5. Unplug unit before removing lid.  TROUBLESHOOTING GUIDE Pump not running, Water not flowing to the pad, Pad is not getting cold 1. Make sure the transformer is plugged into the wall outlet. 2. Confirm that the ice and water are filled to the indicated levels. 3. Make sure there are no kinks in the pad. 4. Gently pull on the blue tube to make sure the tube/pad junction is straight. 5. Remove the pad from the treatment site and ll it while the pad is lying at; then reapply. 6. Confirm that the pad couplings are securely attached to the unit. Listen for the double clicks (Figure 1) to confirm the pad couplings are  securely attached.  Leaks    Note: Some condensation on the lines, controller, and pads is unavoidable, especially in warmer climates. 1. If using a [employer] Polar Care Cold Therapy unit with a detachable Cold Therapy Pad, and a leak exists (other than condensation on the lines) disconnect the pad couplings. Make sure the silver tabs on the couplings are depressed before reconnecting the pad to the pump hose; then confirm both sides of the coupling are properly clicked in. 2. If the coupling continues to leak or a leak is detected in the pad itself, stop using it and call [employer] Customer Care at [phone].  Cleaning After use, empty and dry the unit with a soft cloth. Warm water and mild detergent may be used occasionally to clean the pump and tubes.  WARNING: The Polar  Care Cube can be cold enough to cause serious injury, including full skin necrosis. Follow these Operating Instructions, and carefully read the Product Insert (see pouch on side of unit) and the Cold Therapy Pad Fitting Instructions (provided with each Cold Therapy Pad) prior to use.

## 2024-02-16 NOTE — Progress Notes (Signed)
 Assisted Dr. Kradel with right, infraclavicular, ultrasound guided block. Side rails up, monitors on throughout procedure. See vital signs in flow sheet. Tolerated Procedure well.

## 2024-02-16 NOTE — Anesthesia Postprocedure Evaluation (Signed)
"   Anesthesia Post Note  Patient: Megan Rosales  Procedure(s) Performed: ARTHROSCOPY, SHOULDER, WITH ROTATOR CUFF REPAIR (Right) DECOMPRESSION, SUBACROMIAL SPACE (Right) TENODESIS, BICEPS (Right)  Patient location during evaluation: PACU Anesthesia Type: Regional and General Level of consciousness: awake and sedated Pain management: pain level controlled Vital Signs Assessment: post-procedure vital signs reviewed and stable Respiratory status: spontaneous breathing, nonlabored ventilation, respiratory function stable and patient connected to nasal cannula oxygen Cardiovascular status: blood pressure returned to baseline and stable Postop Assessment: no apparent nausea or vomiting Anesthetic complications: no   No notable events documented.   Last Vitals:  Vitals:   02/16/24 0948 02/16/24 1000  BP: 113/62 127/79  Pulse: 66 92  Resp: 18 (!) 21  Temp: (!) 36.3 C   SpO2: 100% 100%    Last Pain:  Vitals:   02/16/24 0948  TempSrc:   PainSc: Asleep                 Redell MARLA Breaker      "

## 2024-02-16 NOTE — Anesthesia Preprocedure Evaluation (Signed)
"                                    Anesthesia Evaluation  Patient identified by MRN, date of birth, ID band Patient awake    Reviewed: Allergy & Precautions, H&P , NPO status , Patient's Chart, lab work & pertinent test results  Airway Mallampati: II  TM Distance: >3 FB Neck ROM: Full    Dental no notable dental hx.    Pulmonary neg pulmonary ROS, Patient abstained from smoking., former smoker   Pulmonary exam normal breath sounds clear to auscultation       Cardiovascular + Past MI  Normal cardiovascular exam Rhythm:Regular Rate:Normal     Neuro/Psych negative neurological ROS  negative psych ROS   GI/Hepatic Neg liver ROS,GERD  ,,  Endo/Other  negative endocrine ROS    Renal/GU negative Renal ROS  negative genitourinary   Musculoskeletal negative musculoskeletal ROS (+)    Abdominal   Peds negative pediatric ROS (+)  Hematology negative hematology ROS (+)   Anesthesia Other Findings   Reproductive/Obstetrics negative OB ROS                              Anesthesia Physical Anesthesia Plan  ASA: 2  Anesthesia Plan: General and Regional   Post-op Pain Management: Regional block and Minimal or no pain anticipated   Induction: Intravenous  PONV Risk Score and Plan:   Airway Management Planned:   Additional Equipment:   Intra-op Plan:   Post-operative Plan: Extubation in OR  Informed Consent: I have reviewed the patients History and Physical, chart, labs and discussed the procedure including the risks, benefits and alternatives for the proposed anesthesia with the patient or authorized representative who has indicated his/her understanding and acceptance.     Dental advisory given  Plan Discussed with: CRNA  Anesthesia Plan Comments:         Anesthesia Quick Evaluation  "

## 2024-02-16 NOTE — H&P (Signed)
 Paper H&P to be scanned into permanent record. H&P reviewed. No significant changes noted.

## 2024-02-16 NOTE — Op Note (Signed)
 SURGERY DATE: 02/16/2024   PRE-OP DIAGNOSIS:  1. Right subacromial impingement 2. Right biceps tendinopathy 3. Right rotator cuff tear 4. Right acromioclavicular joint arthritis  POST-OP DIAGNOSIS: 1. Right subacromial impingement 2. Right biceps tendinopathy 3. Right rotator cuff tear 4. Right acromioclavicular joint arthritis  PROCEDURES:  1. Right arthroscopic rotator cuff repair (full-thickness supraspinatus with retraction to apex of humeral head) 2. Right arthroscopic biceps tenodesis 3. Right arthroscopic subacromial decompression 4. Right arthroscopic extensive debridement of shoulder (glenohumeral and subacromial spaces) 5. Right arthroscopic distal clavicle excision  SURGEON: Earnestine HILARIO Blanch, MD   ASSISTANT: DOROTHA Krystal Doyne, PA   ANESTHESIA: Gen with Exparel  interscalene block   ESTIMATED BLOOD LOSS: 5cc   DRAINS:  none   TOTAL IV FLUIDS: per anesthesia      SPECIMENS: none   IMPLANTS:  - Arthrex 2.79mm PushLock x 1 - Arthrex 4.41mm SwiveLock x 2 - Iconix SPEED double loaded with 1.2 and 2.0mm tape x 2     OPERATIVE FINDINGS:  Examination under anesthesia: A careful examination under anesthesia was performed.  Passive range of motion was: FF: 150; ER at side: 70; ER in abduction: 100; IR in abduction: 45.  Anterior load shift: NT.  Posterior load shift: NT.  Sulcus in neutral: NT.  Sulcus in ER: NT.     Intra-operative findings: A thorough arthroscopic examination of the shoulder was performed.  The findings are: 1. Biceps tendon: split-thickness tear 2. Superior labrum: degenerative fraying 3. Posterior labrum and capsule: normal 4. Inferior capsule and inferior recess: normal 5. Glenoid cartilage surface: Normal 6. Supraspinatus attachment: full-thickness U-shaped tear of the supraspinatus with retraction to humeral head apex 7. Posterior rotator cuff attachment: normal 8. Humeral head articular cartilage: normal 9. Rotator interval: significant  synovitis 10: Subscapularis tendon: attachment intact 11. Anterior labrum: Mildly degenerative 12. IGHL: normal   OPERATIVE REPORT:    Indications for procedure:  Megan Rosales is a 74 y.o. female with approximately 3 months of right shoulder pain after a car door hit her shoulder.  She has had difficulty with overhead motion since that time with sensations of weakness. Clinical exam and MRI were suggestive of rotator cuff tear, biceps tendinopathy, acromioclavicular joint arthritis and subacromial impingement. After discussion of risks, benefits, and alternatives to surgery, the patient elected to proceed.    Procedure in detail:   I identified Megan Rosales in the pre-operative holding area.  I marked the operative shoulder with my initials. I reviewed the risks and benefits of the proposed surgical intervention, and the patient wished to proceed.  Anesthesia was then performed with an Exparel  interscalene block.  The patient was transferred to the operative suite and placed in the beach chair position.     Appropriate IV antibiotics were administered prior to incision. The operative upper extremity was then prepped and draped in standard fashion. A time out was performed confirming the correct extremity, correct patient, and correct procedure.    I then created a standard posterior portal with an 11 blade. The glenohumeral joint was easily entered with a blunt trocar and the arthroscope introduced. The findings of diagnostic arthroscopy are described above. I debrided degenerative tissue including the synovitic tissue about the rotator interval and anterior and superior labrum. I then coagulated the inflamed synovium to obtain hemostasis and reduce the risk of post-operative swelling using an Arthrocare radiofrequency device.   I then turned my attention to the arthroscopic biceps tenodesis. The Loop n Tack technique was used to pass  a FiberTape through the biceps in a locked fashion adjacent to  the biceps anchor.  A hole for a 2.9 mm Arthrex PushLock was drilled in the bicipital groove just superior to the subscapularis tendon insertion.  The biceps tendon was then cut and the biceps anchor complex was debrided down to a stable base on the superior labrum.  The FiberTape was loaded onto the PushLock anchor and impacted into place into the previously drilled hole in the bicipital groove.  This appropriately secured the biceps into the bicipital groove and took it off of tension.   Next, the arthroscope was then introduced into the subacromial space. A direct lateral portal was created with an 11-blade after spinal needle localization. An extensive subacromial bursectomy and debridement was performed using a combination of the shaver and Arthrocare wand. The entire acromial undersurface was exposed and the CA ligament was subperiosteally elevated to expose the anterior acromial hook. A burr was used to create a flat anterior and lateral aspect of the acromion, converting it from a Type 3 to a Type 1 acromion. Care was made to keep the deltoid fascia intact.   I then turned my attention to the arthroscopic distal clavicle excision. I identified the acromioclavicular joint. Surrounding bursal tissue was debrided and the edges of the joint were identified. I used the 5.66mm barrel burr to remove the distal clavicle parallel to the edge of the acromion. I was able to fit two widths of the burr into the space between the distal clavicle and acromion, signifying that I had removed ~29mm of distal clavicle. This was confirmed by viewing anteriorly and introducing a probe with measuring marks from the lateral portal. Hemostasis was achieved with an Arthrocare wand.   Next, I created an accessory posterolateral portal to assist with visualization and instrumentation.  I debrided the poor quality edges of the supraspinatus tendon.  This was a U-shaped tear of the supraspinatus.  I prepared the footprint using a  burr to expose bleeding bone.     I then percutaneously placed one Iconix SPEED medial row anchor along the anterior portion of the tear at the articular margin. Another SPEED anchor was placed along the posterior portion of the tear at the articular margin. I then shuttled all the strands of tape through the rotator cuff just lateral to the musculotendinous junction using a FirstPass suture passer spanning the anterior to posterior extent of the tear. The posterior strands of each suture were passed through an [employer] anchor.  This was placed approximately 2 cm distal to the lateral edge of the footprint in line with the posterior aspect of the tear with appropriate tensioning of each suture prior to final fixation.  Similarly, the anterior strands of each suture were passed through another SwiveLock anchor along the anterior margin of the tear.  The knotless mechanism of the SwiveLock anchors were utilized to further reduce the midportion of the tear and the anterior rotator cable region.  This construct allowed for excellent reapproximation of the rotator cuff to its native footprint without undue tension.  Appropriate compression was achieved.  The repair was stable to external and internal rotation.   Fluid was evacuated from the shoulder, and the portals were closed with 3-0 Nylon. Xeroform was applied to the portals. A sterile dressing was applied, followed by a Polar Care sleeve and a SlingShot shoulder immobilizer/sling. The patient was awakened from anesthesia without difficulty and was transferred to the PACU in stable condition.    Of  note, assistance from a PA was essential to performing the surgery.  PA was present for the entire surgery.  PA assisted with patient positioning, retraction, instrumentation, and wound closure. The surgery would have been more difficult and had longer operative time without PA assistance.   COMPLICATIONS: none   DISPOSITION: plan for discharge home  after recovery in PACU     POSTOPERATIVE PLAN: Remain in sling (except hygiene and elbow/wrist/hand RoM exercises as instructed by PT) x 6 weeks and NWB for this time. PT to begin 3-4 days after surgery.  Large rotator cuff repair rehab protocol. ASA 325mg  daily x 2 weeks for DVT ppx.
